# Patient Record
Sex: Female | Born: 1975 | Race: Black or African American | Hispanic: No | Marital: Married | Smoking: Never smoker
Health system: Southern US, Community
[De-identification: ages and names within clinical notes are randomized; demographics above are authoritative.]

## PROBLEM LIST (undated history)

## (undated) DIAGNOSIS — E785 Hyperlipidemia, unspecified: Secondary | ICD-10-CM

## (undated) DIAGNOSIS — K219 Gastro-esophageal reflux disease without esophagitis: Secondary | ICD-10-CM

## (undated) DIAGNOSIS — T7840XA Allergy, unspecified, initial encounter: Secondary | ICD-10-CM

## (undated) HISTORY — PX: OTHER SURGICAL HISTORY: SHX169

## (undated) HISTORY — DX: Allergy, unspecified, initial encounter: T78.40XA

## (undated) HISTORY — DX: Hyperlipidemia, unspecified: E78.5

---

## 2005-07-25 ENCOUNTER — Other Ambulatory Visit: Admission: RE | Admit: 2005-07-25 | Discharge: 2005-07-25 | Payer: Self-pay | Admitting: Obstetrics and Gynecology

## 2005-08-19 ENCOUNTER — Encounter: Admission: RE | Admit: 2005-08-19 | Discharge: 2005-08-19 | Payer: Self-pay | Admitting: Obstetrics and Gynecology

## 2006-12-05 ENCOUNTER — Encounter: Admission: RE | Admit: 2006-12-05 | Discharge: 2006-12-05 | Payer: Self-pay | Admitting: Obstetrics and Gynecology

## 2008-01-31 ENCOUNTER — Inpatient Hospital Stay (HOSPITAL_COMMUNITY): Admission: AD | Admit: 2008-01-31 | Discharge: 2008-02-04 | Payer: Self-pay | Admitting: Obstetrics and Gynecology

## 2008-02-09 ENCOUNTER — Ambulatory Visit: Admission: RE | Admit: 2008-02-09 | Discharge: 2008-02-09 | Payer: Self-pay | Admitting: Obstetrics and Gynecology

## 2008-12-26 ENCOUNTER — Emergency Department (HOSPITAL_COMMUNITY): Admission: EM | Admit: 2008-12-26 | Discharge: 2008-12-26 | Payer: Self-pay | Admitting: Emergency Medicine

## 2009-09-03 ENCOUNTER — Inpatient Hospital Stay (HOSPITAL_COMMUNITY): Admission: AD | Admit: 2009-09-03 | Discharge: 2009-09-03 | Payer: Self-pay | Admitting: Obstetrics and Gynecology

## 2009-09-03 ENCOUNTER — Ambulatory Visit: Payer: Self-pay | Admitting: Obstetrics and Gynecology

## 2009-09-10 ENCOUNTER — Inpatient Hospital Stay (HOSPITAL_COMMUNITY): Admission: AD | Admit: 2009-09-10 | Discharge: 2009-09-15 | Payer: Self-pay | Admitting: Obstetrics and Gynecology

## 2009-09-11 ENCOUNTER — Encounter (INDEPENDENT_AMBULATORY_CARE_PROVIDER_SITE_OTHER): Payer: Self-pay | Admitting: Obstetrics & Gynecology

## 2010-02-14 ENCOUNTER — Encounter: Admission: RE | Admit: 2010-02-14 | Discharge: 2010-02-14 | Payer: Self-pay | Admitting: Obstetrics and Gynecology

## 2010-08-17 ENCOUNTER — Encounter
Admission: RE | Admit: 2010-08-17 | Discharge: 2010-08-17 | Payer: Self-pay | Source: Home / Self Care | Attending: Obstetrics and Gynecology | Admitting: Obstetrics and Gynecology

## 2010-10-28 LAB — CBC
HCT: 38.8 % (ref 36.0–46.0)
RDW: 14.3 % (ref 11.5–15.5)
WBC: 10.6 10*3/uL — ABNORMAL HIGH (ref 4.0–10.5)

## 2010-10-28 LAB — WET PREP, GENITAL
Clue Cells Wet Prep HPF POC: NONE SEEN
Trich, Wet Prep: NONE SEEN

## 2010-10-31 LAB — CBC
HCT: 36.7 % (ref 36.0–46.0)
Hemoglobin: 12 g/dL (ref 12.0–15.0)
MCHC: 32.6 g/dL (ref 30.0–36.0)
MCV: 87.2 fL (ref 78.0–100.0)
Platelets: 165 10*3/uL (ref 150–400)
RBC: 4.21 MIL/uL (ref 3.87–5.11)
WBC: 16.7 10*3/uL — ABNORMAL HIGH (ref 4.0–10.5)

## 2010-12-25 NOTE — Op Note (Signed)
NAME:  Cathy Hamilton, Cathy Hamilton NO.:  [id number]   MEDICAL RECORD NO.:  [id number]          PATIENT TYPE:  INP   LOCATION:  9124                          FACILITY:  [facility]   PHYSICIAN:  Juluis Mire, M.D.   DATE OF BIRTH:  [birth year]   DATE OF PROCEDURE:  02/01/2008  DATE OF DISCHARGE:                               OPERATIVE REPORT   PREOPERATIVE DIAGNOSING:  Pregnancy at 41 weeks with failed vacuum  extractor and subsequent failure to descend.   POSTOPERATIVE DIAGNOSIS:  Pregnancy at 41 weeks with failed vacuum  extractor and subsequent failure to descend.   PROCEDURE:  Low transverse cesarean section.   SURGEON:  Juluis Mire, MD.   ANESTHESIA:  Epidural.   ESTIMATED BLOOD LOSS:  700 mL.   PACKS AND DRAINS:  None.   INTRAOPERATIVE BLOOD PLACED:  None.   COMPLICATIONS:  None.   INDICATIONS:  The patient is a 35 year old gravida 3, para 0 female at  31 weeks present on January 31, 2008, for Cytotec ripening cervix.  Subsequent induction was begun on the morning of February 01, 2008.  She  progressed to complete dilatation after 2-1/2 hours of pushing the fetal  vertex, would crown, but then would retract.  We attempted a vacuum  extractor after the risks were discussed.  This was unsuccessful  bringing the vertex any further down out of the pelvis.  On palpation,  she did have very prominent spines and neural inlet.  We felt that this  was leading to inability the vertex to deliver.  Decision was to proceed  with a primary cesarean section.  The risks were discussed including the  risk of infection.  Risk of hemorrhage that could require transfusion  with the risk of AIDS or hepatitis, risk of injury to adjacent organs  including bladder, bowel, ureters could require further exploratory  surgery, risk of deep venous thrombosis and pulmonary emboli.   PROCEDURE:  The patient was taken to the OR, placed in supine position  with left lateral tilt.  After satisfactory  level of epidural anesthesia  had been attained, the abdomen was prepped out with Betadine and draped  as sterile field.  A low transverse skin incision made with knife,  carried through subcutaneous tissue.  The fascia was entered sharply and  incision the fascia laterally.  Fascia taken off the muscle superiorly  and inferiorly.  Rectus muscles were separated in the midline.  Peritoneum was entered sharply.  Incision of the peritoneum was extended  both superiorly and inferiorly.  A low transverse bladder flap was  developed.  A low transverse uterine incision begun with knife and  extended laterally using manual traction.  She had 1+ meconium stained  fluid.  The fetal vertex was wedged into the pelvis.  The nurse was able  to push it out vaginally and we were able then to do elevation head and  fundal pressure to deliver a viable female. Apgars were 8/9, umbilical  artery pH was 7.27.  Weight is pending.  Placenta was delivered  manually.  Uterus was exteriorized for closure.  Evaluation of the  uterine incision revealed  no extension.  Uterine incision was closed  with locking suture of 0 chromic using 2-layer closure technique.  Urine  output was clear and adequate.  Tubes and ovaries were unremarkable.  No  active bleeding was noted.  Uterus was returned to the abdominal cavity.  We irrigated the pelvis, had excellent hemostasis.  Muscles and  peritoneum closed with a suture of 3-0 Vicryl.  Fascia was closed with  running suture of 0 PDS.  Skin was closed with staples and Steri-Strips.  Sponge, instrument, and needle counts were reported as correct by  circulating nurse x2.  Foley catheter remained clear at the time of  closure.  The patient tolerated the procedure well and was returned to  the recovery room in good condition.      Juluis Mire, M.D.  Electronically Signed     JSM/MEDQ  D:  02/01/2008  T:  02/02/2008  Job:  [id number]

## 2010-12-28 NOTE — Discharge Summary (Signed)
Cathy Hamilton, Cathy Hamilton           ACCOUNT NO.:  [id number]   MEDICAL RECORD NO.:  [id number]         PATIENT TYPE:  WINP   LOCATION:                                [facility]  [facility]   PHYSICIAN:  Michelle L. Grewal, M.D.DATE OF BIRTH:  Jul 23, 1976   DATE OF ADMISSION:  01/31/2008  DATE OF DISCHARGE:  02/04/2008                               DISCHARGE SUMMARY   ADMITTING DIAGNOSES:  1. Intrauterine pregnancy at 70 weeks estimated gestational age.  2. Two-stage induction of labor secondary to post date.   DISCHARGE DIAGNOSIS:  1. Status post low-transverse cesarean section.  2. Viable female infant.   PROCEDURE:  Primary low-transverse cesarean section.   REASON FOR ADMISSION:  Please see written H&P.   HOSPITAL COURSE:  The patient 35 year old gravida 3, para 0 that was  admitted to [facility] at 41 weeks estimated gestational  age for a two-stage induction of labor.  On admission, the patient was  taken to labor and delivery area where Cytotec was administered for  ripening of the cervix.  On the following morning, the patient was  started on Pitocin and she did achieve full dilatation.  After pushing  for approximately 2-1/2 hours the fetal vertex would crown, but then  would retract.  Decision was made to attempt a vacuum extraction  delivery.  This was not successful and the patient was noted to have  very prominent response and a narrow inlet and this was thought to be  the difficulty with inability for the vertex to deliver.  Decision was  made to transfer the patient to the operating room where epidural was  dosed to an adequate surgical level.  A low-transverse incision was  made, delivery of a viable female infant, weighing 8 pounds 5 ounces  with Apgars of 9 at and 9 at 5 minute.  Arterial cord pH was  7.27.  The patient tolerated procedure well and was taken to recovery  room in stable condition.  On postoperative day #1, the patient was  without  complaint.  Vital signs were stable.  She was afebrile.  Abdomen  soft with good return of bowel function.  The abdominal dressing had to  be clean, dry, and intact.  Laboratory findings revealed hemoglobin of  11.6, platelet count 129,000, and WBC count of 15.1.  On postoperative  day #2, the patient was without complaint.  Vital signs were stable.  She is afebrile.  Fundus firm and nontender.  Incision was clean, dry,  and intact.  She was ambulating well.  Laboratory values revealed  hemoglobin of 11.9, platelet count was up to 168,000, and WBC count of  15.0.  On postoperative day #3, the patient was without complaint.  Vital signs were stable.  She was afebrile.  Fundus firm and nontender.  Incision was clean, dry, and intact.  Staples removed and the patient  was later discharged home.   CONDITION ON DISCHARGE:  Stable.   DIET:  Regular as tolerated.   ACTIVITY:  No heavy lifting, no driving x2 weeks, and no vaginal entry.   FOLLOWUP:  The patient to follow  up in the office in 1-2 weeks for an  incision check.  She is to call for temperature greater than 100  degrees, persistent nausea, vomiting, heavy vaginal bleeding, and/or  redness or drainage from incisional site.   DISCHARGE MEDICATIONS:  1. Tylox #31 p.o. q.4-6 h. p.r.n.  2. Motrin 600 mg every 6 hours.  3. Prenatal vitamins 1 p.o. daily.  4. Colace 1 p.o. daily p.r.n.      Julio Sicks, N.P.      Stann Mainland. Vincente Poli, M.D.  Electronically Signed    CC/MEDQ  D:  03/09/2008  T:  03/09/2008  Job:  [id number]

## 2011-02-01 ENCOUNTER — Inpatient Hospital Stay (INDEPENDENT_AMBULATORY_CARE_PROVIDER_SITE_OTHER)
Admission: RE | Admit: 2011-02-01 | Discharge: 2011-02-01 | Disposition: A | Discharge status: Home / Self Care | Payer: BC Managed Care – PPO | Source: Ambulatory Visit | Attending: Family Medicine | Admitting: Family Medicine

## 2011-02-01 DIAGNOSIS — T2610XA Burn of cornea and conjunctival sac, unspecified eye, initial encounter: Secondary | ICD-10-CM

## 2011-05-09 LAB — CBC
HCT: 35.8 — ABNORMAL LOW
Hemoglobin: 11.6 — ABNORMAL LOW
Hemoglobin: 11.9 — ABNORMAL LOW
MCHC: 33.2
Platelets: 166
RBC: 4.07
RDW: 15.6 — ABNORMAL HIGH
RDW: 16.3 — ABNORMAL HIGH
WBC: 15.1 — ABNORMAL HIGH

## 2011-05-09 LAB — RPR: RPR Ser Ql: NONREACTIVE

## 2011-08-13 HISTORY — DX: Maternal care for unspecified type scar from previous cesarean delivery: O34.219

## 2011-08-16 LAB — OB RESULTS CONSOLE ABO/RH

## 2011-08-21 ENCOUNTER — Ambulatory Visit (INDEPENDENT_AMBULATORY_CARE_PROVIDER_SITE_OTHER): Payer: 59

## 2011-08-21 DIAGNOSIS — R059 Cough, unspecified: Secondary | ICD-10-CM

## 2011-08-21 DIAGNOSIS — R05 Cough: Secondary | ICD-10-CM

## 2011-09-16 ENCOUNTER — Other Ambulatory Visit: Payer: Self-pay | Admitting: Obstetrics and Gynecology

## 2011-09-16 DIAGNOSIS — D249 Benign neoplasm of unspecified breast: Secondary | ICD-10-CM

## 2011-09-20 ENCOUNTER — Ambulatory Visit
Admission: RE | Admit: 2011-09-20 | Discharge: 2011-09-20 | Disposition: A | Discharge status: Home / Self Care | Payer: 59 | Source: Ambulatory Visit | Attending: Obstetrics and Gynecology | Admitting: Obstetrics and Gynecology

## 2011-09-20 DIAGNOSIS — D249 Benign neoplasm of unspecified breast: Secondary | ICD-10-CM

## 2012-03-10 ENCOUNTER — Encounter (HOSPITAL_COMMUNITY): Payer: Self-pay

## 2012-03-11 ENCOUNTER — Encounter (HOSPITAL_COMMUNITY): Payer: Self-pay

## 2012-03-11 ENCOUNTER — Encounter (HOSPITAL_COMMUNITY)
Admission: RE | Admit: 2012-03-11 | Discharge: 2012-03-11 | Disposition: A | Discharge status: Home / Self Care | Payer: 59 | Source: Ambulatory Visit | Attending: Obstetrics and Gynecology | Admitting: Obstetrics and Gynecology

## 2012-03-11 ENCOUNTER — Encounter (HOSPITAL_COMMUNITY): Payer: Self-pay | Admitting: Pharmacist

## 2012-03-11 HISTORY — DX: Gastro-esophageal reflux disease without esophagitis: K21.9

## 2012-03-11 LAB — CBC
Hemoglobin: 11.7 g/dL — ABNORMAL LOW (ref 12.0–15.0)
MCH: 26.3 pg (ref 26.0–34.0)
MCV: 82.2 fL (ref 78.0–100.0)
Platelets: 145 10*3/uL — ABNORMAL LOW (ref 150–400)
RBC: 4.45 MIL/uL (ref 3.87–5.11)
RDW: 14.4 % (ref 11.5–15.5)
WBC: 7.7 10*3/uL (ref 4.0–10.5)

## 2012-03-11 LAB — ABO/RH: ABO/RH(D): B POS

## 2012-03-11 LAB — TYPE AND SCREEN: Antibody Screen: NEGATIVE

## 2012-03-11 NOTE — Patient Instructions (Addendum)
   Your procedure is scheduled on: Friday August 9th  Enter through the Main Entrance of [facility] at: [employer] up the phone at the desk and dial [phone] and inform us of your arrival.  Please call this number if you have any problems the morning of surgery: [phone]  Remember: Do not eat food after midnight: Thursday Do not drink clear liquids after: midnight Thursday Take these medicines the morning of surgery with a SIP OF WATER: none  Do not wear jewelry, make-up, or FINGER nail polish No metal in your hair or on your body. Do not wear lotions, powders, perfumes or deodorant. Do not shave 48 hours prior to surgery. Do not bring valuables to the hospital. Contacts, dentures or bridgework may not be worn into surgery.  Leave suitcase in the car. After Surgery it may be brought to your room. For patients being admitted to the hospital, checkout time is 11:00am the day of discharge.  Patients discharged on the day of surgery will not be allowed to drive home.     Remember to use your hibiclens as instructed.Please shower with 1/2 bottle the evening before your surgery and the other 1/2 bottle the morning of surgery. Neck down avoiding private area.

## 2012-03-13 ENCOUNTER — Other Ambulatory Visit (HOSPITAL_COMMUNITY): Payer: 59

## 2012-03-13 NOTE — H&P (Signed)
  Patient name  Eliska, Hamil DICTATION#  [id number] CSN# [id number]  Juluis Mire, MD 03/13/2012 8:58 AM

## 2012-03-16 NOTE — H&P (Signed)
NAME:  Cathy Hamilton, Cathy Hamilton NO.:  [id number]  MEDICAL RECORD NO.:  [id number]  LOCATION:                                 FACILITY:  PHYSICIAN:  Juluis Mire, M.D.   DATE OF BIRTH:  [birth year]  DATE OF ADMISSION: DATE OF DISCHARGE:                             HISTORY & PHYSICAL   Date of planned surgery is March 20, 2012, at [facility].  The patient is a 36 year old, gravida 5, para 2, married female, estimated date confinement March 23, 2012, given her estimated gestational age of 39+ weeks.  She presents for repeat cesarean section.  Her prenatal course has been uncomplicated.  She had no evidence of gestational diabetes.  Group B strep was negative.  She had a prior cesarean section done in 2009 for failure to descent.  Subsequently had a trial of labor.  In 2011, had uterine rupture requiring emergent cesarean section.  She now presents for repeat cesarean section.  In terms of allergies, she is allergic to penicillin that causes a rash.  MEDICATIONS:  Include prenatal vitamins.  PAST MEDICAL HISTORY:  Please see prenatal records.  FAMILY HISTORY:  Please see prenatal records.  SOCIAL HISTORY:  Please see prenatal records.  REVIEW OF SYSTEMS:  Noncontributory.  PHYSICAL EXAMINATION:  VITAL SIGNS:  The patient is afebrile.  Stable vital signs. HEENT:  The patient is normocephalic.  Pupils are equal, round, reactive to light and accommodation.  Extraocular movements are intact.  Sclerae and conjunctivae are clear.  Oropharynx was clear. NECK:  Without thyromegaly. BREASTS:  Not examined. LUNGS:  Clear. CARDIOVASCULAR:  Regular rhythm and rate without murmurs or gallops. ABDOMEN:  Gravid uterus consistent with dates.  Well-healed low- transverse incision.  Cervix not examined. EXTREMITIES:  Trace edema. NEUROLOGIC:  Grossly within normal limits.  IMPRESSION: 1. Intrauterine pregnancy at term with 2 prior cesarean sections. 2. Repeat cesarean  section.  PLAN:  The patient will undergo repeat cesarean section.  Risks of surgery have been discussed including the risk of infection.  The risk of hemorrhage could require transfusion with the risk of AIDS or hepatitis.  Excessive bleeding could require hysterectomy.  There is a risk of injury to adjacent organs including bladder, bowel, ureters that could require further exploratory surgery.  Risk of deep venous thrombosis and pulmonary embolus.  The patient appears to understand potential risks and complications.     Juluis Mire, M.D.     JSM/MEDQ  D:  03/13/2012  T:  03/14/2012  Job:  [id number]

## 2012-03-19 MED ORDER — GENTAMICIN SULFATE 40 MG/ML IJ SOLN
INTRAVENOUS | Status: AC
  Administered 2012-03-20: 100 mL via INTRAVENOUS
  Filled 2012-03-19: qty 10.15

## 2012-03-20 ENCOUNTER — Encounter (HOSPITAL_COMMUNITY)
Admission: RE | Disposition: A | Discharge status: Home / Self Care | Payer: Self-pay | Source: Ambulatory Visit | Attending: Obstetrics and Gynecology

## 2012-03-20 ENCOUNTER — Encounter (HOSPITAL_COMMUNITY): Payer: Self-pay | Admitting: *Deleted

## 2012-03-20 ENCOUNTER — Encounter (HOSPITAL_COMMUNITY): Payer: Self-pay | Admitting: Anesthesiology

## 2012-03-20 ENCOUNTER — Inpatient Hospital Stay (HOSPITAL_COMMUNITY)
Admission: RE | Admit: 2012-03-20 | Discharge: 2012-03-22 | DRG: 766 | Disposition: A | Discharge status: Home / Self Care | Payer: 59 | Source: Ambulatory Visit | Attending: Obstetrics and Gynecology | Admitting: Obstetrics and Gynecology

## 2012-03-20 ENCOUNTER — Inpatient Hospital Stay (HOSPITAL_COMMUNITY): Payer: 59 | Admitting: Anesthesiology

## 2012-03-20 DIAGNOSIS — O34219 Maternal care for unspecified type scar from previous cesarean delivery: Principal | ICD-10-CM

## 2012-03-20 DIAGNOSIS — Z98891 History of uterine scar from previous surgery: Secondary | ICD-10-CM

## 2012-03-20 LAB — TYPE AND SCREEN
ABO/RH(D): B POS
Antibody Screen: NEGATIVE

## 2012-03-20 LAB — CBC
MCH: 26.2 pg (ref 26.0–34.0)
MCHC: 32.3 g/dL (ref 30.0–36.0)
Platelets: 144 10*3/uL — ABNORMAL LOW (ref 150–400)

## 2012-03-20 SURGERY — Surgical Case
Anesthesia: Spinal | Site: Abdomen | Wound class: Clean Contaminated

## 2012-03-20 MED ORDER — KETOROLAC TROMETHAMINE 30 MG/ML IJ SOLN: 30.0000 mg | Freq: Four times a day (QID) | INTRAMUSCULAR | Status: AC | PRN

## 2012-03-20 MED ORDER — SODIUM CHLORIDE 0.9 % IJ SOLN: 3.0000 mL | INTRAMUSCULAR | Status: DC | PRN

## 2012-03-20 MED ORDER — WITCH HAZEL-GLYCERIN EX PADS: 1.0000 "application " | MEDICATED_PAD | CUTANEOUS | Status: DC | PRN

## 2012-03-20 MED ORDER — NALBUPHINE HCL 10 MG/ML IJ SOLN
5.0000 mg | INTRAMUSCULAR | Status: DC | PRN
  Filled 2012-03-20: qty 1

## 2012-03-20 MED ORDER — SIMETHICONE 80 MG PO CHEW
80.0000 mg | CHEWABLE_TABLET | Freq: Three times a day (TID) | ORAL | Status: DC
  Administered 2012-03-20 – 2012-03-22 (×8): 80 mg via ORAL

## 2012-03-20 MED ORDER — OXYTOCIN 40 UNITS IN LACTATED RINGERS INFUSION - SIMPLE MED: 62.5000 mL/h | INTRAVENOUS | Status: AC

## 2012-03-20 MED ORDER — SCOPOLAMINE 1 MG/3DAYS TD PT72: 1.0000 | MEDICATED_PATCH | Freq: Once | TRANSDERMAL | Status: DC

## 2012-03-20 MED ORDER — SIMETHICONE 80 MG PO CHEW: 80.0000 mg | CHEWABLE_TABLET | ORAL | Status: DC | PRN

## 2012-03-20 MED ORDER — ONDANSETRON HCL 4 MG/2ML IJ SOLN: 4.0000 mg | INTRAMUSCULAR | Status: DC | PRN

## 2012-03-20 MED ORDER — ZOLPIDEM TARTRATE 5 MG PO TABS: 5.0000 mg | ORAL_TABLET | Freq: Every evening | ORAL | Status: DC | PRN

## 2012-03-20 MED ORDER — NALOXONE HCL 0.4 MG/ML IJ SOLN: 0.4000 mg | INTRAMUSCULAR | Status: DC | PRN

## 2012-03-20 MED ORDER — SODIUM CHLORIDE 0.9 % IJ SOLN: 3.0000 mL | Freq: Two times a day (BID) | INTRAMUSCULAR | Status: DC

## 2012-03-20 MED ORDER — LANOLIN HYDROUS EX OINT: 1.0000 "application " | TOPICAL_OINTMENT | CUTANEOUS | Status: DC | PRN

## 2012-03-20 MED ORDER — ONDANSETRON HCL 4 MG/2ML IJ SOLN
INTRAMUSCULAR | Status: DC | PRN
  Administered 2012-03-20: 4 mg via INTRAVENOUS

## 2012-03-20 MED ORDER — PRENATAL MULTIVITAMIN CH
1.0000 | ORAL_TABLET | Freq: Every day | ORAL | Status: DC
  Administered 2012-03-21 – 2012-03-22 (×2): 1 via ORAL
  Filled 2012-03-20 (×2): qty 1

## 2012-03-20 MED ORDER — FLEET ENEMA 7-19 GM/118ML RE ENEM: 1.0000 | ENEMA | Freq: Every day | RECTAL | Status: DC | PRN

## 2012-03-20 MED ORDER — IBUPROFEN 600 MG PO TABS
600.0000 mg | ORAL_TABLET | Freq: Four times a day (QID) | ORAL | Status: DC
  Administered 2012-03-20 – 2012-03-22 (×8): 600 mg via ORAL
  Filled 2012-03-20 (×2): qty 1

## 2012-03-20 MED ORDER — KETOROLAC TROMETHAMINE 60 MG/2ML IM SOLN
60.0000 mg | Freq: Once | INTRAMUSCULAR | Status: AC | PRN
  Administered 2012-03-20: 60 mg via INTRAMUSCULAR

## 2012-03-20 MED ORDER — OXYCODONE-ACETAMINOPHEN 5-325 MG PO TABS: 1.0000 | ORAL_TABLET | ORAL | Status: DC | PRN

## 2012-03-20 MED ORDER — BISACODYL 10 MG RE SUPP: 10.0000 mg | Freq: Every day | RECTAL | Status: DC | PRN

## 2012-03-20 MED ORDER — LACTATED RINGERS IV SOLN
INTRAVENOUS | Status: DC
  Administered 2012-03-20: 14:00:00 via INTRAVENOUS

## 2012-03-20 MED ORDER — ONDANSETRON HCL 4 MG PO TABS: 4.0000 mg | ORAL_TABLET | ORAL | Status: DC | PRN

## 2012-03-20 MED ORDER — BUPIVACAINE IN DEXTROSE 0.75-8.25 % IT SOLN
INTRATHECAL | Status: DC | PRN
  Administered 2012-03-20: 1.4 mL via INTRATHECAL

## 2012-03-20 MED ORDER — SCOPOLAMINE 1 MG/3DAYS TD PT72
1.0000 | MEDICATED_PATCH | Freq: Once | TRANSDERMAL | Status: DC
  Filled 2012-03-20: qty 1

## 2012-03-20 MED ORDER — MORPHINE SULFATE (PF) 0.5 MG/ML IJ SOLN
INTRAMUSCULAR | Status: DC | PRN
  Administered 2012-03-20: .1 mg via INTRATHECAL

## 2012-03-20 MED ORDER — MENTHOL 3 MG MT LOZG: 1.0000 | LOZENGE | OROMUCOSAL | Status: DC | PRN

## 2012-03-20 MED ORDER — DIPHENHYDRAMINE HCL 25 MG PO CAPS: 25.0000 mg | ORAL_CAPSULE | Freq: Four times a day (QID) | ORAL | Status: DC | PRN

## 2012-03-20 MED ORDER — DIPHENHYDRAMINE HCL 25 MG PO CAPS: 25.0000 mg | ORAL_CAPSULE | ORAL | Status: DC | PRN

## 2012-03-20 MED ORDER — SENNOSIDES-DOCUSATE SODIUM 8.6-50 MG PO TABS
2.0000 | ORAL_TABLET | Freq: Every day | ORAL | Status: DC
  Administered 2012-03-20 – 2012-03-21 (×2): 2 via ORAL

## 2012-03-20 MED ORDER — DIPHENHYDRAMINE HCL 50 MG/ML IJ SOLN: 12.5000 mg | INTRAMUSCULAR | Status: DC | PRN

## 2012-03-20 MED ORDER — METOCLOPRAMIDE HCL 5 MG/ML IJ SOLN: 10.0000 mg | Freq: Three times a day (TID) | INTRAMUSCULAR | Status: DC | PRN

## 2012-03-20 MED ORDER — LACTATED RINGERS IV SOLN
INTRAVENOUS | Status: DC
  Administered 2012-03-20: 125 mL/h via INTRAVENOUS
  Administered 2012-03-20 (×2): via INTRAVENOUS

## 2012-03-20 MED ORDER — TETANUS-DIPHTH-ACELL PERTUSSIS 5-2.5-18.5 LF-MCG/0.5 IM SUSP: 0.5000 mL | Freq: Once | INTRAMUSCULAR | Status: DC

## 2012-03-20 MED ORDER — PHENYLEPHRINE HCL 10 MG/ML IJ SOLN
INTRAMUSCULAR | Status: DC | PRN
  Administered 2012-03-20: 80 ug via INTRAVENOUS
  Administered 2012-03-20 (×3): 40 ug via INTRAVENOUS
  Administered 2012-03-20: 80 ug via INTRAVENOUS
  Administered 2012-03-20 (×2): 40 ug via INTRAVENOUS

## 2012-03-20 MED ORDER — IBUPROFEN 600 MG PO TABS
600.0000 mg | ORAL_TABLET | Freq: Four times a day (QID) | ORAL | Status: DC | PRN
  Filled 2012-03-20 (×6): qty 1

## 2012-03-20 MED ORDER — ONDANSETRON HCL 4 MG/2ML IJ SOLN: 4.0000 mg | Freq: Three times a day (TID) | INTRAMUSCULAR | Status: DC | PRN

## 2012-03-20 MED ORDER — FENTANYL CITRATE 0.05 MG/ML IJ SOLN: 25.0000 ug | INTRAMUSCULAR | Status: DC | PRN

## 2012-03-20 MED ORDER — SODIUM CHLORIDE 0.9 % IV SOLN: 250.0000 mL | INTRAVENOUS | Status: DC

## 2012-03-20 MED ORDER — FENTANYL CITRATE 0.05 MG/ML IJ SOLN
INTRAMUSCULAR | Status: DC | PRN
  Administered 2012-03-20: 12.5 ug via INTRATHECAL

## 2012-03-20 MED ORDER — MEPERIDINE HCL 25 MG/ML IJ SOLN: 6.2500 mg | INTRAMUSCULAR | Status: DC | PRN

## 2012-03-20 MED ORDER — DIBUCAINE 1 % RE OINT: 1.0000 "application " | TOPICAL_OINTMENT | RECTAL | Status: DC | PRN

## 2012-03-20 MED ORDER — DIPHENHYDRAMINE HCL 50 MG/ML IJ SOLN: 25.0000 mg | INTRAMUSCULAR | Status: DC | PRN

## 2012-03-20 MED ORDER — OXYTOCIN 40 UNITS IN LACTATED RINGERS INFUSION - SIMPLE MED
INTRAVENOUS | Status: DC | PRN
  Administered 2012-03-20: 40 [IU] via INTRAVENOUS

## 2012-03-20 MED ORDER — SODIUM CHLORIDE 0.9 % IV SOLN
1.0000 ug/kg/h | INTRAVENOUS | Status: DC | PRN
  Filled 2012-03-20: qty 2.5

## 2012-03-20 SURGICAL SUPPLY — 30 items
CLOTH BEACON ORANGE TIMEOUT ST (SAFETY) ×2 IMPLANT
DRESSING TELFA 8X3 (GAUZE/BANDAGES/DRESSINGS) ×1 IMPLANT
DRSG COVADERM 4X10 (GAUZE/BANDAGES/DRESSINGS) ×1 IMPLANT
ELECT REM PT RETURN 9FT ADLT (ELECTROSURGICAL) ×2
ELECTRODE REM PT RTRN 9FT ADLT (ELECTROSURGICAL) ×1 IMPLANT
EXTRACTOR VACUUM M CUP 4 TUBE (SUCTIONS) IMPLANT
GAUZE SPONGE 4X4 12PLY STRL LF (GAUZE/BANDAGES/DRESSINGS) ×3 IMPLANT
GLOVE BIO SURGEON STRL SZ7 (GLOVE) ×4 IMPLANT
GOWN PREVENTION PLUS LG XLONG (DISPOSABLE) ×4 IMPLANT
KIT ABG SYR 3ML LUER SLIP (SYRINGE) ×2 IMPLANT
NDL HYPO 25X5/8 SAFETYGLIDE (NEEDLE) ×1 IMPLANT
NEEDLE HYPO 25X5/8 SAFETYGLIDE (NEEDLE) ×2 IMPLANT
NS IRRIG 1000ML POUR BTL (IV SOLUTION) ×2 IMPLANT
PACK C SECTION WH (CUSTOM PROCEDURE TRAY) ×2 IMPLANT
PAD ABD 7.5X8 STRL (GAUZE/BANDAGES/DRESSINGS) ×1 IMPLANT
SLEEVE SCD COMPRESS KNEE MED (MISCELLANEOUS) IMPLANT
STAPLER VISISTAT 35W (STAPLE) IMPLANT
STRIP CLOSURE SKIN 1/4X4 (GAUZE/BANDAGES/DRESSINGS) ×2 IMPLANT
SUT CHROMIC 0 CT 1 (SUTURE) IMPLANT
SUT CHROMIC 0 CTX 36 (SUTURE) ×4 IMPLANT
SUT CHROMIC 2 0 SH (SUTURE) IMPLANT
SUT PDS AB 0 CT 36 (SUTURE) ×2 IMPLANT
SUT PLAIN 0 NONE (SUTURE) IMPLANT
SUT PLAIN 2 0 XLH (SUTURE) IMPLANT
SUT VIC AB 3-0 CT1 27 (SUTURE) ×2
SUT VIC AB 3-0 CT1 TAPERPNT 27 (SUTURE) ×1 IMPLANT
TAPE CLOTH SURG 4X10 WHT LF (GAUZE/BANDAGES/DRESSINGS) ×1 IMPLANT
TOWEL OR 17X24 6PK STRL BLUE (TOWEL DISPOSABLE) ×4 IMPLANT
TRAY FOLEY CATH 14FR (SET/KITS/TRAYS/PACK) ×2 IMPLANT
WATER STERILE IRR 1000ML POUR (IV SOLUTION) ×2 IMPLANT

## 2012-03-20 NOTE — Anesthesia Procedure Notes (Signed)
Spinal  Patient location during procedure: OR Start time: 03/20/2012 7:24 AM End time: 03/20/2012 7:27 AM Staffing Anesthesiologist: Sandrea Hughs Performed by: anesthesiologist  Preanesthetic Checklist Completed: patient identified, site marked, surgical consent, pre-op evaluation, timeout performed, IV checked, risks and benefits discussed and monitors and equipment checked Spinal Block Patient position: sitting Prep: DuraPrep Patient monitoring: heart rate, cardiac monitor, continuous pulse ox and blood pressure Approach: midline Location: L3-4 Injection technique: single-shot Needle Needle type: Sprotte  Needle gauge: 24 G Needle length: 9 cm Needle insertion depth: 7 cm Assessment Sensory level: T4

## 2012-03-20 NOTE — Addendum Note (Signed)
Addendum  created 03/20/12 1314 by Algis Greenhouse, CRNA   Modules edited:Notes Section

## 2012-03-20 NOTE — H&P (Signed)
  History and physical exam unchanged 

## 2012-03-20 NOTE — Anesthesia Postprocedure Evaluation (Signed)
  Anesthesia Post Note  Patient: Cathy Hamilton  Procedure(s) Performed: Procedure(s) (LRB): CESAREAN SECTION (N/A)  Anesthesia type: Spinal  Patient location: Mother/Baby  Post pain: Pain level controlled  Post assessment: Post-op Vital signs reviewed  Last Vitals:  Filed Vitals:   03/20/12 0930  BP: 100/61  Pulse: 63  Temp: 36.2 C  Resp: 16    Post vital signs: Reviewed  Level of consciousness: awake  Complications: No apparent anesthesia complications

## 2012-03-20 NOTE — Op Note (Signed)
Patient name  Cathy Hamilton, Cathy Hamilton DICTATION#[id number] CSN# [id number]  Juluis Mire, MD 03/20/2012 8:10 AM

## 2012-03-20 NOTE — Transfer of Care (Signed)
Immediate Anesthesia Transfer of Care Note  Patient: Cathy Hamilton  Procedure(s) Performed: Procedure(s) (LRB): CESAREAN SECTION (N/A)  Patient Location: PACU  Anesthesia Type: Spinal  Level of Consciousness: awake, alert  and oriented  Airway & Oxygen Therapy: Patient Spontanous Breathing  Post-op Assessment: Report given to PACU RN and Post -op Vital signs reviewed and stable  Post vital signs: Reviewed and stable  Complications: No apparent anesthesia complications

## 2012-03-20 NOTE — Anesthesia Postprocedure Evaluation (Signed)
Anesthesia Post Note  Patient: Cathy Hamilton  Procedure(s) Performed: Procedure(s) (LRB): CESAREAN SECTION (N/A)  Anesthesia type: Spinal  Patient location: PACU  Post pain: Pain level controlled  Post assessment: Post-op Vital signs reviewed  Last Vitals:  Filed Vitals:   03/20/12 0815  BP:   Pulse:   Temp: 36.6 C  Resp:     Post vital signs: Reviewed  Level of consciousness: awake  Complications: No apparent anesthesia complications

## 2012-03-20 NOTE — Op Note (Signed)
NAME:  Cathy Hamilton, Cathy Hamilton NO.:  [id number]  MEDICAL RECORD NO.:  [id number]  LOCATION:  [facility]                          [facility]ILITY:  [facility]  PHYSICIAN:  Juluis Mire, M.D.   DATE OF BIRTH:  08-20-1975  DATE OF PROCEDURE:  03/20/2012 DATE OF DISCHARGE:                              OPERATIVE REPORT   PREOPERATIVE DIAGNOSIS:  Intrauterine pregnancy at 39 weeks with prior cesarean section for repeat.  POSTOPERATIVE DIAGNOSIS:  Intrauterine pregnancy at 39 weeks with prior cesarean section for repeat.  OPERATIVE PROCEDURE:  Low transverse cesarean section.  SURGEON:  Juluis Mire, MD  ASSISTANT:  Mitchel Honour.  ANESTHESIA:  Spinal.  ESTIMATED BLOOD LOSS:  600 mL.  PACKS:  None.  DRAINS:  Urethral Foley.  INTRAOPERATIVE BLOOD PLACED:  None.  COMPLICATIONS:  None.  INDICATION:  Dictated in history and physical.  PROCEDURE:  The patient was taken to the OR and placed in supine position with left lateral tilt.  After satisfactory level of spinal anesthesia was obtained, the abdomen was prepped out with Betadine and draped in sterile field.  A prior low transverse skin incision was excised.  The incision was then extended through subcutaneous tissue. Fascia was identified, entered sharply, incision fashioned laterally. Fascia taken off the muscle superiorly and inferiorly.  Rectus muscles were separated in the midline.  Anterior peritoneum was entered sharply. Incision of perineum extended both superiorly and inferiorly.  Low transverse bladder flap was developed.  Low transverse uterine incision was begun with knife extended laterally using manual traction.  Amniotic fluid was clear.  The infant presented in the vertex presentation, was delivered with elevation of head and fundal pressure.  There was a nuchal cord x1.  The infant was a viable female, Apgars were 9/9, weight is pending.  Placenta was delivered manually.  Uterus was exteriorized for closure.   Uterus was closed with running locking suture of 0 chromic using a two-layer closure technique.  We had good hemostasis and clear urine output.  Tubes and ovaries were unremarkable.  Uterus returned to the abdominal cavity.  We irrigated the pelvis, had good hemostasis, and again clear urine output.  Muscles and peritoneum were closed with interrupted suture of 3-0 Vicryl.  Fascia closed with a running suture of 0 PDS.  Skin was closed with running subcuticular 3-0 Monocryl.  Steri-Strips were also applied.  Sponge, instrument, and needle count was reported as correct by circulating nurse x2.  Foley catheter remained clear at the time of closure.  The patient tolerated the procedure well, was returned to recovery room in good condition.     Juluis Mire, M.D.     JSM/MEDQ  D:  03/20/2012  T:  03/20/2012  Job:  [id number]

## 2012-03-20 NOTE — Anesthesia Preprocedure Evaluation (Signed)
Anesthesia Evaluation  Patient identified by MRN, date of birth, ID band Patient awake    Reviewed: Allergy & Precautions, H&P , NPO status , Patient's Chart, lab work & pertinent test results, reviewed documented beta blocker date and time   History of Anesthesia Complications Negative for: history of anesthetic complications  Airway Mallampati: II TM Distance: >3 FB Neck ROM: full    Dental  (+) Teeth Intact   Pulmonary neg pulmonary ROS,  breath sounds clear to auscultation        Cardiovascular negative cardio ROS  Rhythm:regular Rate:Normal     Neuro/Psych negative neurological ROS  negative psych ROS   GI/Hepatic Neg liver ROS, GERD- (with pregnancy, no meds)  ,  Endo/Other  negative endocrine ROS  Renal/GU negative Renal ROS  negative genitourinary   Musculoskeletal   Abdominal   Peds  Hematology negative hematology ROS (+)   Anesthesia Other Findings   Reproductive/Obstetrics (+) Pregnancy (h/o c/s x2, for repeat c/s)                           Anesthesia Physical Anesthesia Plan  ASA: II  Anesthesia Plan: Spinal   Post-op Pain Management:    Induction:   Airway Management Planned:   Additional Equipment:   Intra-op Plan:   Post-operative Plan:   Informed Consent: I have reviewed the patients History and Physical, chart, labs and discussed the procedure including the risks, benefits and alternatives for the proposed anesthesia with the patient or authorized representative who has indicated his/her understanding and acceptance.     Plan Discussed with: CRNA and Surgeon  Anesthesia Plan Comments:         Anesthesia Quick Evaluation

## 2012-03-20 NOTE — Consult Note (Signed)
Neonatology Note:   Attendance at C-section:    I was asked to attend this repeat C/S at term due to previous uterine rupture. The mother is a G5P2A2 B pos, GBS neg with an uncomplicated pregnancy. ROM at delivery, fluid clear. Infant vigorous with good spontaneous cry and tone. Needed no bulb suctioning. Ap 9/9. Lungs clear to ausc in DR. To CN to care of Pediatrician.   Deatra James, MD

## 2012-03-20 NOTE — Brief Op Note (Signed)
03/20/2012  8:11 AM  PATIENT:  Cathy Hamilton  36 y.o. female  PRE-OPERATIVE DIAGNOSIS:  Previous Cesarean Section x2  POST-OPERATIVE DIAGNOSIS:  Previous Cesarean Section x2  PROCEDURE:  Procedure(s) (LRB): CESAREAN SECTION (N/A)  SURGEON:  Surgeon(s) and Role:    * Juluis Mire, MD - Primary  PHYSICIAN ASSISTANT:   ASSISTANTS: Morris    ANESTHESIA:   spinal  EBL:  Total I/O In: 2300 [I.V.:2300] Out: 700 [Urine:200; Blood:500]  BLOOD ADMINISTERED:none  DRAINS: Urinary Catheter (Foley)   LOCAL MEDICATIONS USED:  NONE  SPECIMEN:  No Specimen  DISPOSITION OF SPECIMEN:  N/A  COUNTS:  YES  TOURNIQUET:  * No tourniquets in log *  DICTATION: .Other Dictation: Dictation Number [id number]  PLAN OF CARE: Admit to inpatient   PATIENT DISPOSITION:  PACU - hemodynamically stable.   Delay start of Pharmacological VTE agent (>24hrs) due to surgical blood loss or risk of bleeding: no

## 2012-03-21 DIAGNOSIS — O34219 Maternal care for unspecified type scar from previous cesarean delivery: Secondary | ICD-10-CM

## 2012-03-21 DIAGNOSIS — Z98891 History of uterine scar from previous surgery: Secondary | ICD-10-CM

## 2012-03-21 HISTORY — DX: History of uterine scar from previous surgery: Z98.891

## 2012-03-21 LAB — CBC
Hemoglobin: 11.4 g/dL — ABNORMAL LOW (ref 12.0–15.0)
RBC: 4.3 MIL/uL (ref 3.87–5.11)

## 2012-03-21 NOTE — Progress Notes (Signed)
Subjective: Postpartum Day one: Cesarean Delivery Patient reports tolerating PO and no problems voiding.    Objective: Vital signs in last 24 hours: Temp:  [97.8 F (36.6 C)-98.6 F (37 C)] 98.6 F (37 C) (08/10 0729) Pulse Rate:  [58-111] 58  (08/10 0729) Resp:  [16-20] 18  (08/10 0729) BP: (93-110)/(54-64) 103/63 mmHg (08/10 0729) SpO2:  [95 %-99 %] 96 % (08/10 0729)  Physical Exam:  General: alert Lochia: appropriate Uterine Fundus: firm Incision: healing well DVT Evaluation: No evidence of DVT seen on physical exam.   Basename 03/21/12 0555 03/20/12 0700  HGB 11.4* 12.0  HCT 35.4* 37.2    Assessment/Plan: Status post Cesarean section. Doing well postoperatively.  Continue current care.  Lamont Glasscock S 03/21/2012, 9:44 AM

## 2012-03-22 NOTE — Discharge Summary (Signed)
Obstetric Discharge Summary Reason for Admission: cesarean section Prenatal Procedures: none Intrapartum Procedures: cesarean: low cervical, transverse Postpartum Procedures: none Complications-Operative and Postpartum: none Hemoglobin  Date Value Range Status  03/21/2012 11.4* 12.0 - 15.0 g/dL Final     HCT  Date Value Range Status  03/21/2012 35.4* 36.0 - 46.0 % Final    Physical Exam:  General: alert Lochia: appropriate Uterine Fundus: firm Incision: healing well DVT Evaluation: No evidence of DVT seen on physical exam.  Discharge Diagnoses: Term Pregnancy-delivered  Discharge Information: Date: 03/22/2012 Activity: pelvic rest Diet: routine Medications: Ibuprofen and Percocet Condition: stable Instructions: refer to practice specific booklet Discharge to: home   Newborn Data: Live born female  Birth Weight: 8 lb 14.3 oz (4035 g) APGAR: 9, 9  Home with mother.  Tanis Burnley S 03/22/2012, 10:36 AM

## 2012-03-22 NOTE — Progress Notes (Signed)
Notitfied Dr Arelia Sneddon patient would like to be discharged today.

## 2012-03-22 NOTE — Progress Notes (Signed)
Subjective: Postpartum Day two: Cesarean Delivery Patient reports incisional pain, tolerating PO and no problems voiding.    Objective: Vital signs in last 24 hours: Temp:  [97.8 F (36.6 C)-98.7 F (37.1 C)] 97.8 F (36.6 C) (08/11 0600) Pulse Rate:  [59-74] 59  (08/11 0600) Resp:  [18] 18  (08/11 0600) BP: (102-109)/(52-66) 102/52 mmHg (08/11 0600)  Physical Exam:  General: alert Lochia: appropriate Uterine Fundus: firm Incision: healing well DVT Evaluation: No evidence of DVT seen on physical exam.   Basename 03/21/12 0555 03/20/12 0700  HGB 11.4* 12.0  HCT 35.4* 37.2    Assessment/Plan: Status post Cesarean section. Doing well postoperatively.  Continue current care.  Shanyn Preisler S 03/22/2012, 9:03 AM

## 2012-03-23 ENCOUNTER — Encounter (HOSPITAL_COMMUNITY): Payer: Self-pay | Admitting: Obstetrics and Gynecology

## 2012-03-23 MED FILL — Scopolamine TD Patch 72HR 1 MG/3DAYS: TRANSDERMAL | Qty: 1 | Status: AC

## 2012-10-03 ENCOUNTER — Ambulatory Visit (INDEPENDENT_AMBULATORY_CARE_PROVIDER_SITE_OTHER): Payer: 59 | Admitting: Internal Medicine

## 2012-10-03 VITALS — BP 98/62 | HR 63 | Temp 98.6°F | Resp 17 | Ht 67.0 in | Wt 150.0 lb

## 2012-10-03 DIAGNOSIS — E785 Hyperlipidemia, unspecified: Secondary | ICD-10-CM

## 2012-10-03 DIAGNOSIS — E7800 Pure hypercholesterolemia, unspecified: Secondary | ICD-10-CM | POA: Insufficient documentation

## 2012-10-03 HISTORY — DX: Pure hypercholesterolemia, unspecified: E78.00

## 2012-10-03 NOTE — Progress Notes (Signed)
  Subjective:    Patient ID: Cathy Hamilton, female    DOB: 11-13-1975, 37 y.o.   MRN: [id number]  HPI healthy 37 year old Currently nursing her child Head screening lab showing cholesterol over 350 with LDL 250 range/HDL 92 or above Triglycerides normal Was advised to take medication Her calculated heart risk is less than 3% Family history is negative for heart attacks/mother has high cholesterol but is alive and well  Past medical history no illnesses or medications  Review of Systems Review of systems negative    Objective:   Physical Exam Blood pressure 98/62 heart rate 63 and regular       Assessment & Plan:  Problem #1 high cholesterol with no risk for heart disease  Does not need medication Lifestyle changes 2gm fish oil or flax seed Followup late summer to assess

## 2012-11-12 ENCOUNTER — Encounter (HOSPITAL_COMMUNITY): Payer: Self-pay

## 2012-11-12 ENCOUNTER — Emergency Department (HOSPITAL_COMMUNITY)
Admission: EM | Admit: 2012-11-12 | Discharge: 2012-11-12 | Disposition: A | Discharge status: Home / Self Care | Payer: 59 | Source: Home / Self Care | Attending: Emergency Medicine | Admitting: Emergency Medicine

## 2012-11-12 DIAGNOSIS — N390 Urinary tract infection, site not specified: Secondary | ICD-10-CM

## 2012-11-12 LAB — POCT URINALYSIS DIP (DEVICE)
Bilirubin Urine: NEGATIVE
Glucose, UA: NEGATIVE mg/dL
Ketones, ur: NEGATIVE mg/dL
Nitrite: NEGATIVE
Specific Gravity, Urine: 1.01 (ref 1.005–1.030)

## 2012-11-12 MED ORDER — PHENAZOPYRIDINE HCL 200 MG PO TABS: 200.0000 mg | ORAL_TABLET | Freq: Three times a day (TID) | ORAL | Status: DC | PRN

## 2012-11-12 MED ORDER — CEPHALEXIN 500 MG PO CAPS: 500.0000 mg | ORAL_CAPSULE | Freq: Three times a day (TID) | ORAL | Status: DC

## 2012-11-12 NOTE — ED Notes (Signed)
C/o pain low back, pain in left groin, pain w urination, pain in abdominal area w ambulation ; NAD

## 2012-11-12 NOTE — ED Provider Notes (Signed)
Chief Complaint:   Chief Complaint  Patient presents with  . Back Pain    History of Present Illness:   Cathy Hamilton is a 37 year old female who has had a three-day history of pain in her mid back area, worse on the left than the right, aching all over , dysuria, frequency, headache, and nausea. She denies any fever, vomiting, URI symptoms, cough, abdominal pain, hematuria, or GYN complaints. No prior history of urinary tract infection. She is taking clindamycin for a dental infection. Penicillin caused her to break out in a rash. She is breast-feeding.  Review of Systems:  Other than noted above, the patient denies any of the following symptoms: General:  No fevers, chills, sweats, aches, or fatigue. GI:  No abdominal pain, back pain, nausea, vomiting, diarrhea, or constipation. GU:  No dysuria, frequency, urgency, hematuria, or incontinence. GYN:  No discharge, itching, vulvar pain or lesions, pelvic pain, or abnormal vaginal bleeding.  PMFSH:  Past medical history, family history, social history, meds, and allergies were reviewed.    Physical Exam:   Vital signs:  BP 109/59  Pulse 98  Temp(Src) 99.1 F (37.3 C) (Oral)  Resp 16  SpO2 100% Gen:  Alert, oriented, in no distress. Lungs:  Clear to auscultation, no wheezes, rales or rhonchi. Heart:  Regular rhythm, no gallop or murmer. Abdomen:  Flat and soft. There was slight suprapubic pain to palpation.  No guarding, or rebound.  No hepato-splenomegaly or mass.  Bowel sounds were normally active.  No hernia. Back:  No CVA tenderness.  Skin:  Clear, warm and dry.  Labs:   Results for orders placed during the hospital encounter of 11/12/12  POCT URINALYSIS DIP (DEVICE)      Result Value Range   Glucose, UA NEGATIVE  NEGATIVE mg/dL   Bilirubin Urine NEGATIVE  NEGATIVE   Ketones, ur NEGATIVE  NEGATIVE mg/dL   Specific Gravity, Urine 1.010  1.005 - 1.030   Hgb urine dipstick TRACE (*) NEGATIVE   pH 6.5  5.0 - 8.0   Protein,  ur NEGATIVE  NEGATIVE mg/dL   Urobilinogen, UA 0.2  0.0 - 1.0 mg/dL   Nitrite NEGATIVE  NEGATIVE   Leukocytes, UA NEGATIVE  NEGATIVE  POCT PREGNANCY, URINE      Result Value Range   Preg Test, Ur NEGATIVE  NEGATIVE     Other Labs Obtained at [facility]:  A urine culture was obtained.  Results are pending at this time and we will call about any positive results.  Assessment: The encounter diagnosis was UTI (lower urinary tract infection).   The urine was not very remarkable, yet she has all the symptoms of urinary tract infection. We'll go ahead and treat with cephalexin, pending the results of the urine culture. Cephalexin should be okay for her to take while breast-feeding. She only has one day of clindamycin remaining, so we will only need overlap drugs for one day. She was encouraged to return if she should feel worse in any way particularly with fever, chills, vomiting, or worsening pain.  Plan:   1.  The following meds were prescribed:   Discharge Medication List as of 11/12/2012  3:17 PM     2.  The patient was instructed in symptomatic care and handouts were given. 3.  The patient was told to return if becoming worse in any way, if no better in 3 or 4 days, and given some red flag symptoms such as fever, vomiting, or worsening pain that  would indicate earlier return. 4.  The patient was told to avoid intercourse for 10 days, get extra fluids, and return for a follow up with her primary care doctor at the completion of treatment for a repeat UA and culture.     Reuben Likes, MD 11/12/12 [phone]

## 2012-11-12 NOTE — ED Notes (Signed)
Call back number for lab issues verified 

## 2012-11-13 LAB — URINE CULTURE: Culture: NO GROWTH

## 2013-08-18 ENCOUNTER — Ambulatory Visit: Payer: 59 | Admitting: Internal Medicine

## 2013-10-20 ENCOUNTER — Encounter: Payer: 59 | Admitting: Internal Medicine

## 2013-12-15 ENCOUNTER — Encounter: Payer: Self-pay | Admitting: Internal Medicine

## 2013-12-15 ENCOUNTER — Ambulatory Visit (INDEPENDENT_AMBULATORY_CARE_PROVIDER_SITE_OTHER): Payer: 59 | Admitting: Internal Medicine

## 2013-12-15 VITALS — BP 111/58 | HR 86 | Temp 98.4°F | Resp 16 | Ht 66.5 in | Wt 135.0 lb

## 2013-12-15 DIAGNOSIS — E785 Hyperlipidemia, unspecified: Secondary | ICD-10-CM

## 2013-12-15 DIAGNOSIS — Z0000 Encounter for general adult medical examination without abnormal findings: Secondary | ICD-10-CM

## 2013-12-15 LAB — CBC WITH DIFFERENTIAL/PLATELET
BASOS ABS: 0 10*3/uL (ref 0.0–0.1)
Basophils Relative: 0 % (ref 0–1)
EOS PCT: 0 % (ref 0–5)
Eosinophils Absolute: 0 10*3/uL (ref 0.0–0.7)
HEMATOCRIT: 40.7 % (ref 36.0–46.0)
Hemoglobin: 13.4 g/dL (ref 12.0–15.0)
LYMPHS ABS: 1.3 10*3/uL (ref 0.7–4.0)
LYMPHS PCT: 27 % (ref 12–46)
MCH: 26 pg (ref 26.0–34.0)
MCHC: 32.9 g/dL (ref 30.0–36.0)
MCV: 78.9 fL (ref 78.0–100.0)
MONO ABS: 0.4 10*3/uL (ref 0.1–1.0)
Monocytes Relative: 9 % (ref 3–12)
NEUTROS ABS: 3.1 10*3/uL (ref 1.7–7.7)
Neutrophils Relative %: 64 % (ref 43–77)
PLATELETS: 233 10*3/uL (ref 150–400)
RBC: 5.16 MIL/uL — AB (ref 3.87–5.11)
RDW: 14.2 % (ref 11.5–15.5)
WBC: 4.9 10*3/uL (ref 4.0–10.5)

## 2013-12-15 NOTE — Progress Notes (Signed)
   Subjective:    Patient ID: Cathy Hamilton, female    DOB: 17-Jul-1976, 38 y.o.   MRN: [id number]  HPI    Review of Systems  HENT: Positive for rhinorrhea and sneezing.   Gastrointestinal: Positive for abdominal pain.  Genitourinary: Positive for urgency.  Neurological: Positive for light-headedness.       Objective:   Physical Exam        Assessment & Plan:

## 2013-12-15 NOTE — Progress Notes (Signed)
Subjective:    Patient ID: Cathy Hamilton, female    DOB: 1976-02-12, 38 y.o.   MRN: [id number] This chart was scribed for Forman. Laney Pastor, MD by Terressa Koyanagi, ED Scribe. This patient was seen in room 25 and the patient's care was started at 11:56 AM.    HPI HPI Comments: Cathy Hamilton is a 38 y.o. female, with a history of elevated cholesterol and cesarean delivery, who presents to the [facility] with multiple complaints.   First Complaint: Pt complains that since her last cesarean she lacks the urge to urinate. Pt reports seeing an urologist who suggested a Cystoscopy, pt reports she has not had the Cystoscopy yet. Pt also reports that there was an ultrasound done which did not show any problems with voiding the bladder. Pt reports one urinary infection in the past year. Pt denies dysuria.  Second Complaint: Pt reports that she had a pap smear recently and is continuing Tdap as the form of birthcontrol because she has difficulty remembering to take a pill every day.   Third Complaint: Pt also complains of twitching of both of her eyes onset two weeks ago. Pt denies trouble sleeping or visual disturbances. 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 Pt further reports that she has seasonal allergies.   Fourth Complaint: Pt complains that she feels intermittent pain and discomfort in her upper abd, bilaterally. Pt reports that the episodes of the pain is aggravated by twisting. Pt denies the episodes are brought on by eating. Further, pt denies doing any new abd exercises or any new physical activity in general.   Pt reports her mother is 11 years old and has DM, had several surgeries, and is overweight. Pt reports her father is deceased and the cause of death was:  alcoholism, HTN, stroke.   immun utd Gyn utd  Review of Systems  Eyes: Negative for visual disturbance.       Eye twitching    Gastrointestinal: Positive for abdominal pain.  Genitourinary: Positive for difficulty urinating. Negative for dysuria, urgency and frequency.  Musculoskeletal: Negative for back pain.    Objective:   Physical Exam  Constitutional: She is oriented to person, place, and time. She appears well-developed and well-nourished. No distress.  HENT:  Head: Normocephalic.  Right Ear: External ear normal.  Left Ear: External ear normal.  Nose: Nose normal.  Mouth/Throat: Oropharynx is clear and moist.  Eyes: Conjunctivae are normal. Pupils are equal, round, and reactive to light. No scleral icterus.  Neck: Normal range of motion. Neck supple. No thyromegaly present.  Cardiovascular: Normal rate and regular rhythm.  Exam reveals no gallop and no friction rub.   No murmur heard. Pulmonary/Chest: Effort normal and breath sounds normal. No respiratory distress. She has no wheezes. She has no rales.  Floating ribs bilaterally which are slightly tender  Abdominal: Soft. Bowel sounds are normal. She exhibits no distension. There is no tenderness. There is no rebound.  No hepatoemagly.   Musculoskeletal: Normal range of motion. She exhibits no edema.  Neurological: She is alert and oriented to person, place, and time.  Skin: Skin is warm and dry. No rash noted.  Psychiatric: She has a normal mood and affect. Her behavior is normal.    Assessment & Plan:  DIAGNOSTIC STUDIES: Oxygen Saturation is 99% on RA, normal by my interpretation.    COORDINATION OF CARE: 12:12 PM-Discussed treatment plan  which includes labs with pt at bedside and pt agreed to plan.   I have completed the patient encounter in its entirety as documented by the scribe, with editing by me where necessary. Aramis Zobel P. Laney Pastor, M.D. Routine general medical examination at a health care facility  Other and unspecified hyperlipidemia - Plan: CBC with Differential, Comprehensive metabolic panel, Lipid panel Eye  twitching--reassured Abdominal complaints w/out clear etio--miralax and follow  Await labs before disposition ?rx lipids

## 2013-12-16 LAB — COMPREHENSIVE METABOLIC PANEL
ALBUMIN: 4.1 g/dL (ref 3.5–5.2)
ALT: 12 U/L (ref 0–35)
AST: 15 U/L (ref 0–37)
Alkaline Phosphatase: 38 U/L — ABNORMAL LOW (ref 39–117)
BUN: 12 mg/dL (ref 6–23)
CALCIUM: 8.9 mg/dL (ref 8.4–10.5)
CHLORIDE: 103 meq/L (ref 96–112)
CO2: 26 meq/L (ref 19–32)
Creat: 0.71 mg/dL (ref 0.50–1.10)
Glucose, Bld: 79 mg/dL (ref 70–99)
POTASSIUM: 3.8 meq/L (ref 3.5–5.3)
Sodium: 138 mEq/L (ref 135–145)
Total Bilirubin: 0.9 mg/dL (ref 0.2–1.2)
Total Protein: 6.5 g/dL (ref 6.0–8.3)

## 2013-12-16 LAB — LIPID PANEL
CHOL/HDL RATIO: 4.7 ratio
Cholesterol: 213 mg/dL — ABNORMAL HIGH (ref 0–200)
HDL: 45 mg/dL (ref >39)
LDL CALC: 158 mg/dL — AB (ref 0–99)
Triglycerides: 51 mg/dL (ref <150)
VLDL: 10 mg/dL (ref 0–40)

## 2013-12-29 ENCOUNTER — Encounter: Payer: Self-pay | Admitting: Internal Medicine

## 2014-04-27 ENCOUNTER — Ambulatory Visit (INDEPENDENT_AMBULATORY_CARE_PROVIDER_SITE_OTHER): Payer: 59 | Admitting: Internal Medicine

## 2014-04-27 VITALS — BP 101/58 | HR 76 | Temp 97.7°F | Resp 16 | Ht 66.5 in | Wt 141.0 lb

## 2014-04-27 DIAGNOSIS — R0789 Other chest pain: Secondary | ICD-10-CM

## 2014-04-27 DIAGNOSIS — R071 Chest pain on breathing: Secondary | ICD-10-CM

## 2014-04-27 NOTE — Progress Notes (Signed)
   Subjective:    Patient ID: Cathy Hamilton, female    DOB: 01-04-1976, 38 y.o.   MRN: [id number]  HPI Recurrent RUQ pain Starts ?why Hurts with movement/rollover Very intermittent over 3-5 mos Cleansing-working out/more active Activity does not seem to precipitate pain but can make it worse No nausea vomiting diarrhea or constipation No reflux symptoms No difficulty breathing No fever chills or night sweats No dyspepsia or reflux  Patient Active Problem List   Diagnosis Date Noted  . Elevated cholesterol 10/03/2012  . Previous cesarean delivery affecting pregnancy 03/21/2012    Class: Present on Admission  . S/P cesarean section 03/21/2012    Class: Status post   Prior to Admission medications   Medication Sig Start Date End Date Taking? Authorizing Provider  ORTHO TRI-CYCLEN LO 0.18/0.215/0.25 MG-25 MCG tab  12/08/13  Yes Historical Provider, MD  Prenatal Vit-Fe Fumarate-FA (PRENATAL MULTIVITAMIN) TABS Take 1 tablet by mouth daily.   Yes Historical Provider, MD    Review of Systems  Constitutional: Negative for fever, activity change, fatigue and unexpected weight change.  Respiratory: Negative for cough, chest tightness, shortness of breath and wheezing.   Cardiovascular: Negative for chest pain, palpitations and leg swelling.  Gastrointestinal: Negative for nausea and vomiting.  Genitourinary: Negative for frequency, difficulty urinating and pelvic pain.       Objective:   Physical Exam BP 101/58  Pulse 76  Temp(Src) 97.7 F (36.5 C)  Resp 16  Ht 5' 6.5" (1.689 m)  Wt 141 lb (63.957 kg)  BMI 22.42 kg/m2  SpO2 100% HEENT clear Heart regular without murmur Neck range of motion full/no cervical adenopathy or thyromegaly Chest clear to auscultation She is tender to palpation over the right lower rib margin in the anterior axillary line Without defect or swelling The abdomen is soft nontender nondistended without organomegaly or masses Extremities  clear       Assessment & Plan:  Chest wall pain  She is advised to increase her flexibility scheduling to daily exercises and to see if lifting her children is possibly part of this etiology//okay to use heat or ice or NSAIDs Followup if not improving or 3 months

## 2014-05-20 ENCOUNTER — Other Ambulatory Visit: Payer: Self-pay | Admitting: Obstetrics and Gynecology

## 2014-05-20 DIAGNOSIS — D242 Benign neoplasm of left breast: Secondary | ICD-10-CM

## 2014-05-27 ENCOUNTER — Other Ambulatory Visit: Payer: 59

## 2014-06-02 ENCOUNTER — Ambulatory Visit
Admission: RE | Admit: 2014-06-02 | Discharge: 2014-06-02 | Disposition: A | Discharge status: Home / Self Care | Payer: 59 | Source: Ambulatory Visit | Attending: Obstetrics and Gynecology | Admitting: Obstetrics and Gynecology

## 2014-06-02 ENCOUNTER — Encounter (INDEPENDENT_AMBULATORY_CARE_PROVIDER_SITE_OTHER): Payer: Self-pay

## 2014-06-02 DIAGNOSIS — D242 Benign neoplasm of left breast: Secondary | ICD-10-CM

## 2014-06-13 ENCOUNTER — Encounter: Payer: Self-pay | Admitting: Internal Medicine

## 2015-01-14 IMAGING — US US BREAST*L* LIMITED INC AXILLA
1 series · 5 of 5 positions shown · non-contrast
Comparison: 09/20/2011 and 08/19/2005

CLINICAL DATA: Followup of left breast fibroadenoma on was
requested. The patient does not have any new areas of concern. The
fibroadenoma was initially evaluated in Friday August, 2005 and last
evaluated in September 2011. At that time, given its stability, no
further imaging followup was suggested of the fibroadenoma. The
patient has 3 children, the youngest is 2 years old.

EXAM:
ULTRASOUND OF THE LEFT BREAST

[Series 1: superficial breast · 5 of 5 slices shown]
[im 1/5]
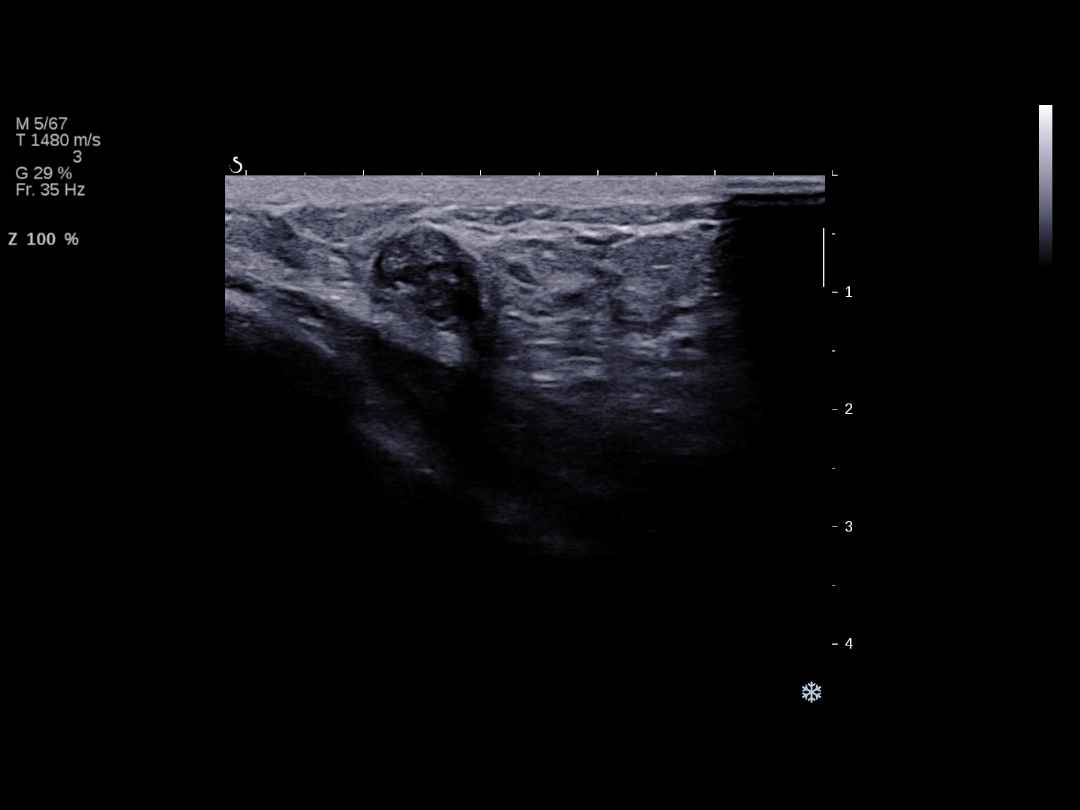
[im 2/5]
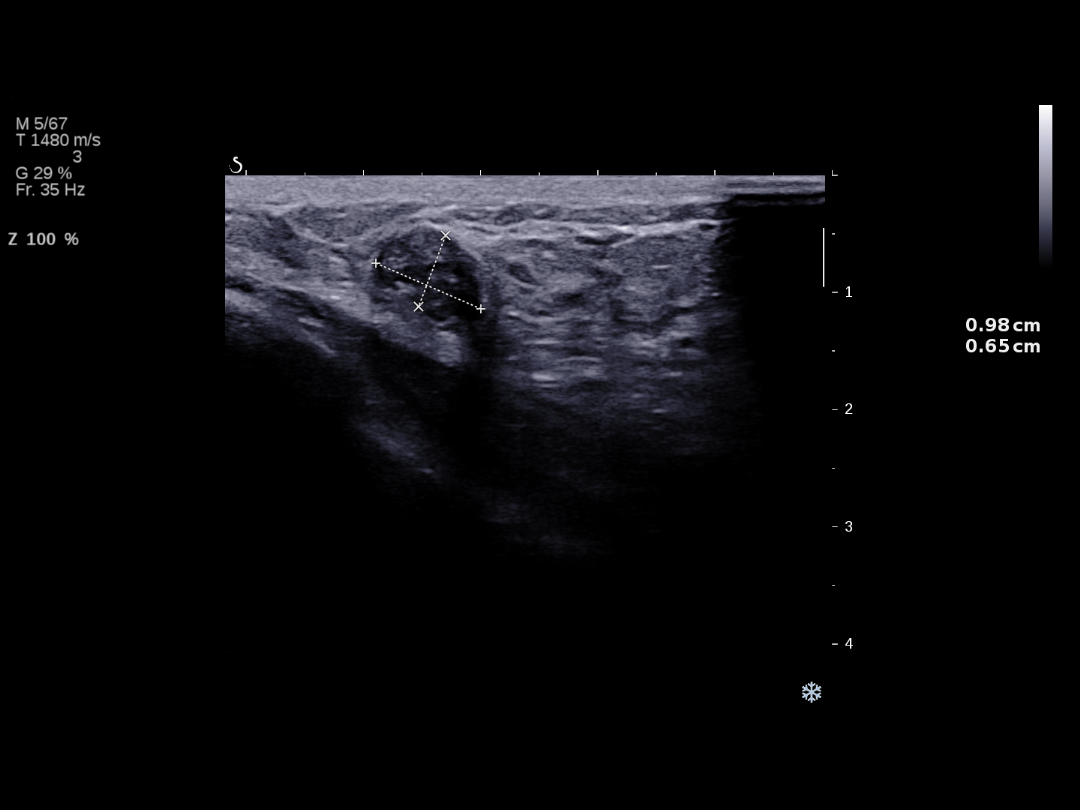
[im 3/5]
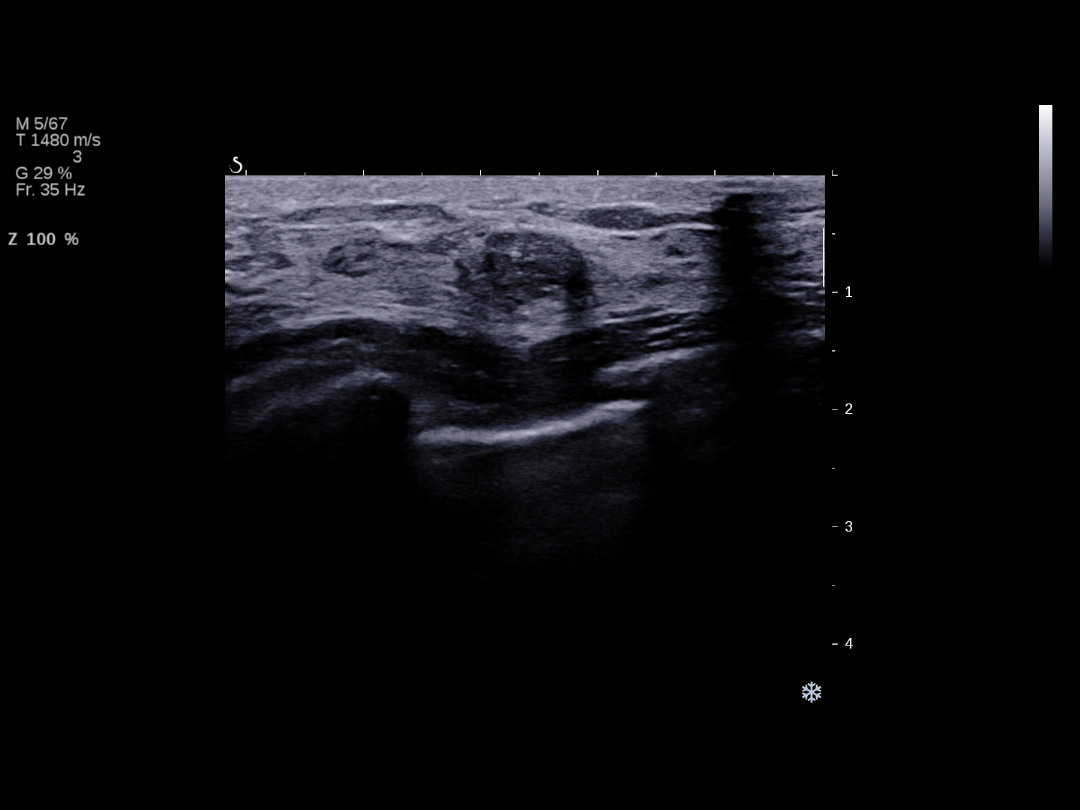
[im 4/5]
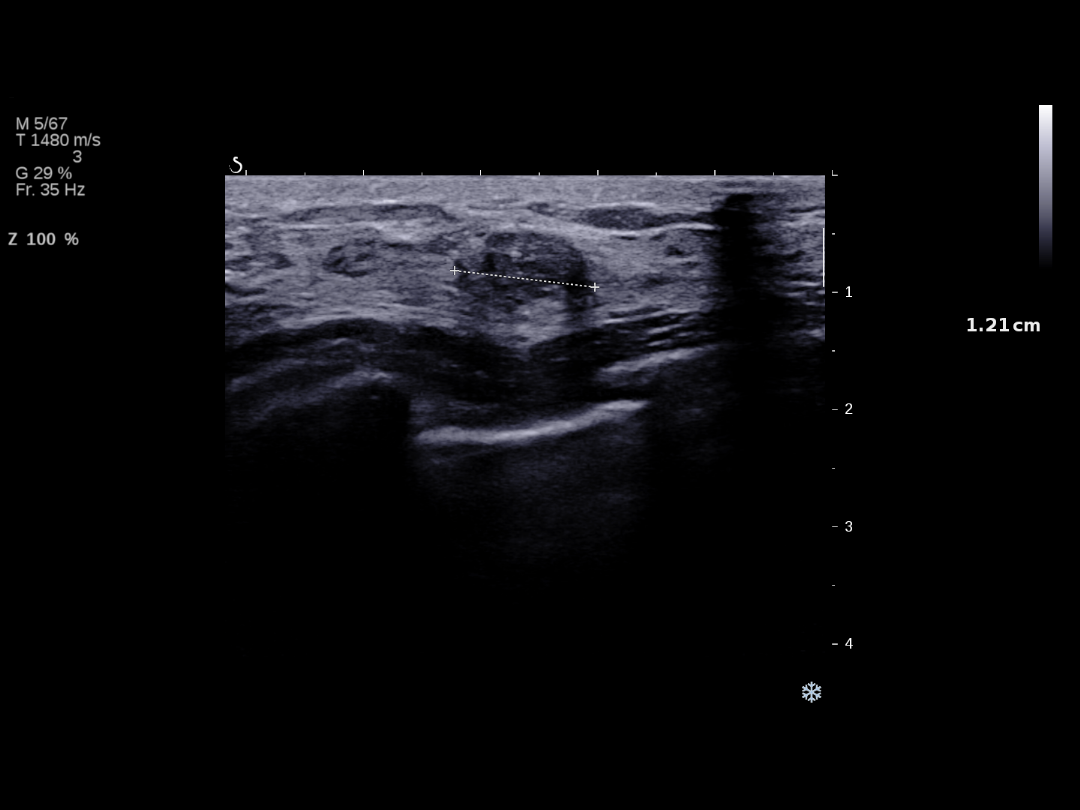
[im 5/5]
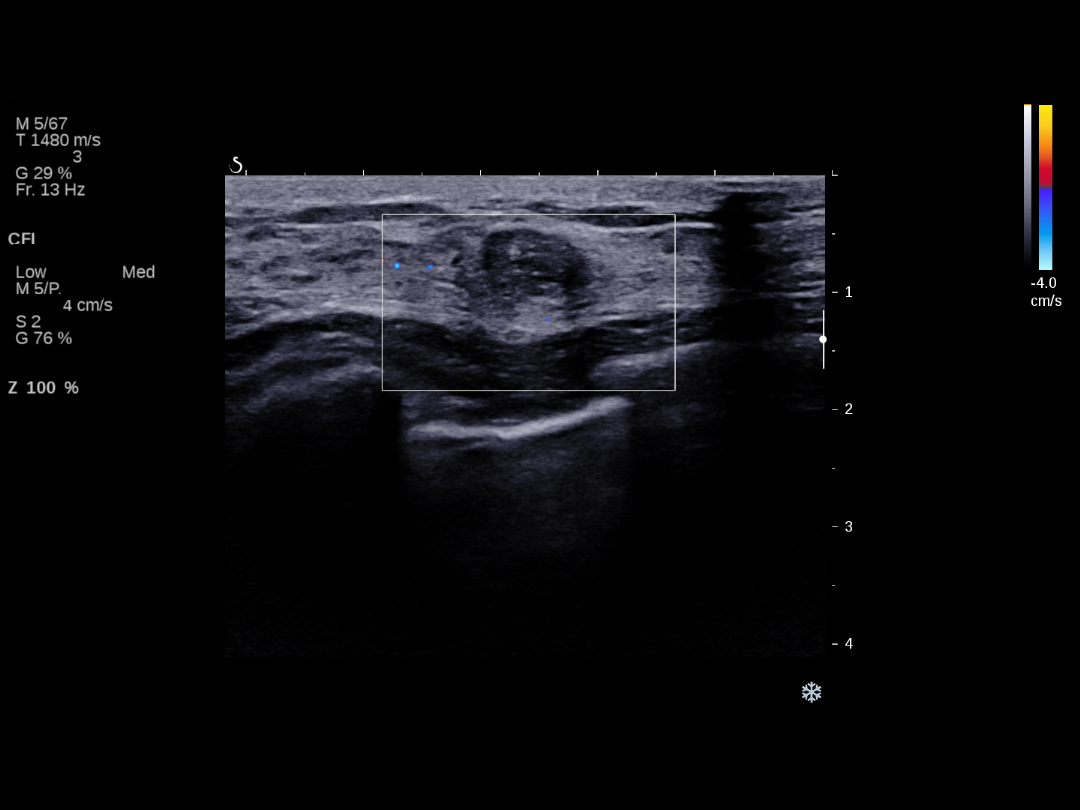

[5 of 5 positions shown; findings below may reference images not displayed]

FINDINGS: On physical exam, I palpate a small mobile mass in the 7 o'clock
position left breast 7 cm from the nipple.

Ultrasound is performed, showing a circumscribed hypoechoic oval
mass with internal echogenic bands at 7 o'clock position 7 cm from
nipple. The mass currently measures 1.2 x 1.0 x 0.7 cm. The mass has
decreased in size since she was last evaluated an 09/20/2011 (was
2.2 x 1.0 x 1.8 cm at that time). No new masses in the inferior left
breast.
IMPRESSION: Interval decrease in size of left breast fibroadenoma. No new mass
is identified in the inferior left breast.

RECOMMENDATION:
Screening mammogram at age 41 unless there are persistent or
intervening clinical concerns. (Code:VB-S-M30)

I have discussed the findings and recommendations with the patient.
Results were also provided in writing at the conclusion of the
visit. If applicable, a reminder letter will be sent to the patient
regarding the next appointment.

BI-RADS CATEGORY  2: Benign.

## 2015-08-02 ENCOUNTER — Encounter: Payer: Self-pay | Admitting: Internal Medicine

## 2015-08-02 ENCOUNTER — Ambulatory Visit (INDEPENDENT_AMBULATORY_CARE_PROVIDER_SITE_OTHER): Payer: BLUE CROSS/BLUE SHIELD | Admitting: Internal Medicine

## 2015-08-02 VITALS — BP 101/64 | HR 88 | Temp 98.2°F | Resp 16 | Ht 67.0 in | Wt 151.0 lb

## 2015-08-02 DIAGNOSIS — Z0000 Encounter for general adult medical examination without abnormal findings: Secondary | ICD-10-CM

## 2015-08-02 DIAGNOSIS — E7800 Pure hypercholesterolemia, unspecified: Secondary | ICD-10-CM

## 2015-08-02 DIAGNOSIS — J0100 Acute maxillary sinusitis, unspecified: Secondary | ICD-10-CM | POA: Diagnosis not present

## 2015-08-02 DIAGNOSIS — J302 Other seasonal allergic rhinitis: Secondary | ICD-10-CM

## 2015-08-02 LAB — CBC WITH DIFFERENTIAL/PLATELET
BASOS PCT: 1 % (ref 0–1)
Basophils Absolute: 0 10*3/uL (ref 0.0–0.1)
Eosinophils Absolute: 0.2 10*3/uL (ref 0.0–0.7)
Eosinophils Relative: 4 % (ref 0–5)
HEMATOCRIT: 41.9 % (ref 36.0–46.0)
HEMOGLOBIN: 13.6 g/dL (ref 12.0–15.0)
LYMPHS PCT: 31 % (ref 12–46)
Lymphs Abs: 1.2 10*3/uL (ref 0.7–4.0)
MCH: 26.4 pg (ref 26.0–34.0)
MCHC: 32.5 g/dL (ref 30.0–36.0)
MCV: 81.4 fL (ref 78.0–100.0)
MONO ABS: 0.5 10*3/uL (ref 0.1–1.0)
MPV: 10.8 fL (ref 8.6–12.4)
Monocytes Relative: 14 % — ABNORMAL HIGH (ref 3–12)
NEUTROS ABS: 1.9 10*3/uL (ref 1.7–7.7)
NEUTROS PCT: 50 % (ref 43–77)
Platelets: 212 10*3/uL (ref 150–400)
RBC: 5.15 MIL/uL — ABNORMAL HIGH (ref 3.87–5.11)
RDW: 14.4 % (ref 11.5–15.5)
WBC: 3.8 10*3/uL — ABNORMAL LOW (ref 4.0–10.5)

## 2015-08-02 MED ORDER — AZITHROMYCIN 250 MG PO TABS: ORAL_TABLET | ORAL | Status: DC

## 2015-08-02 MED ORDER — FLUTICASONE PROPIONATE 50 MCG/ACT NA SUSP: NASAL | Status: DC

## 2015-08-02 MED ORDER — CETIRIZINE-PSEUDOEPHEDRINE ER 5-120 MG PO TB12: 1.0000 | ORAL_TABLET | Freq: Two times a day (BID) | ORAL | Status: DC

## 2015-08-02 NOTE — Progress Notes (Signed)
Subjective:    Patient ID: Cathy Hamilton, female    DOB: Jul 18, 1976, 39 y.o.   MRN: [id number]  HPIhealthy 39yo here for annual PE LDL 150s in past/otherwise healthy HM UTD  Married 3 kids-may be moving w/ husb new job  Review of Systems 14pt neg x nasel congestion for 2 month--unclear if started as allergic PND plus occas cough-nonprod Affects breathing at night but not exercise No night sweats   past week worse with yellow d/c Objective:   Physical Exam  Constitutional: She is oriented to person, place, and time. She appears well-developed and well-nourished. No distress.  HENT:  Head: Normocephalic.  Right Ear: External ear normal.  Left Ear: External ear normal.  Mouth/Throat: Oropharynx is clear and moist.  turbs boggy,wet  Eyes: Conjunctivae and EOM are normal. Pupils are equal, round, and reactive to light.  Neck: Normal range of motion. Neck supple. No thyromegaly present.  Cardiovascular: Normal rate, regular rhythm, normal heart sounds and intact distal pulses.   No murmur heard. Pulmonary/Chest: Effort normal and breath sounds normal. She has no wheezes.  Abdominal: Soft. Bowel sounds are normal. She exhibits no distension and no mass. There is no tenderness. There is no rebound.  Musculoskeletal: Normal range of motion. She exhibits no edema or tenderness.  Lymphadenopathy:    She has no cervical adenopathy.  Neurological: She is alert and oriented to person, place, and time. She has normal reflexes. No cranial nerve deficit.  Skin: Skin is warm and dry. No rash noted.  Psychiatric: She has a normal mood and affect. Her behavior is normal. Judgment and thought content normal.  Nursing note and vitals reviewed. BP 101/64 mmHg  Pulse 88  Temp(Src) 98.2 F (36.8 C)  Resp 16  Ht _0  (1.702 m)  Wt 151 lb (68.493 kg)  BMI 23.64 kg/m2  LMP 07/27/2015       Assessment & Plan:  Annual physical exam - Plan: CBC with Differential/Platelet, Comprehensive  metabolic panel  Elevated cholesterol - Plan: Lipid panel --LDL 213//start meds-lip 20 ck 3-93mo  Acute maxillary sinusitis, recurrence not specified Due to seasonal allergic rhinitis -see rx below  Results for orders placed or performed in visit on 08/02/15  CBC with Differential/Platelet  Result Value Ref Range   WBC 3.8 (L) 4.0 - 10.5 K/uL   RBC 5.15 (H) 3.87 - 5.11 MIL/uL   Hemoglobin 13.6 12.0 - 15.0 g/dL   HCT 41.9 36.0 - 46.0 %   MCV 81.4 78.0 - 100.0 fL   MCH 26.4 26.0 - 34.0 pg   MCHC 32.5 30.0 - 36.0 g/dL   RDW 14.4 11.5 - 15.5 %   Platelets 212 150 - 400 K/uL   MPV 10.8 8.6 - 12.4 fL   Neutrophils Relative % 50 43 - 77 %   Neutro Abs 1.9 1.7 - 7.7 K/uL   Lymphocytes Relative 31 12 - 46 %   Lymphs Abs 1.2 0.7 - 4.0 K/uL   Monocytes Relative 14 (H) 3 - 12 %   Monocytes Absolute 0.5 0.1 - 1.0 K/uL   Eosinophils Relative 4 0 - 5 %   Eosinophils Absolute 0.2 0.0 - 0.7 K/uL   Basophils Relative 1 0 - 1 %   Basophils Absolute 0.0 0.0 - 0.1 K/uL   Smear Review Criteria for review not met   Comprehensive metabolic panel  Result Value Ref Range   Sodium 140 135 - 146 mmol/L   Potassium 4.2 3.5 - 5.3 mmol/L  Chloride 103 98 - 110 mmol/L   CO2 30 20 - 31 mmol/L   Glucose, Bld 92 65 - 99 mg/dL   BUN 9 7 - 25 mg/dL   Creat 0.68 0.50 - 1.10 mg/dL   Total Bilirubin 0.4 0.2 - 1.2 mg/dL   Alkaline Phosphatase 51 33 - 115 U/L   AST 19 10 - 30 U/L   ALT 19 6 - 29 U/L   Total Protein 6.7 6.1 - 8.1 g/dL   Albumin 4.0 3.6 - 5.1 g/dL   Calcium 8.8 8.6 - 10.2 mg/dL  Lipid panel  Result Value Ref Range   Cholesterol 293 (H) 125 - 200 mg/dL   Triglycerides 65 <150 mg/dL   HDL 67 >=46 mg/dL   Total CHOL/HDL Ratio 4.4 <=5.0 Ratio   VLDL 13 <30 mg/dL   LDL Cholesterol 213 (H) <130 mg/dL   Meds ordered this encounter  Medications  . cetirizine-pseudoephedrine (ZYRTEC-D) 5-120 MG tablet    Sig: Take 1 tablet by mouth 2 (two) times daily.    Dispense:  60 tablet    Refill:   1  . azithromycin (ZITHROMAX) 250 MG tablet    Sig: As packaged    Dispense:  6 tablet    Refill:  0  . fluticasone (FLONASE) 50 MCG/ACT nasal spray    Sig: 1 spray each nostril twice a day    Dispense:  16 g    Refill:  11  . atorvastatin (LIPITOR) 20 MG tablet    Sig: Take 1 tablet (20 mg total) by mouth daily.    Dispense:  90 tablet    Refill:  3

## 2015-08-03 ENCOUNTER — Encounter: Payer: Self-pay | Admitting: Internal Medicine

## 2015-08-03 LAB — COMPREHENSIVE METABOLIC PANEL
ALBUMIN: 4 g/dL (ref 3.6–5.1)
ALT: 19 U/L (ref 6–29)
AST: 19 U/L (ref 10–30)
Alkaline Phosphatase: 51 U/L (ref 33–115)
BILIRUBIN TOTAL: 0.4 mg/dL (ref 0.2–1.2)
BUN: 9 mg/dL (ref 7–25)
CO2: 30 mmol/L (ref 20–31)
CREATININE: 0.68 mg/dL (ref 0.50–1.10)
Calcium: 8.8 mg/dL (ref 8.6–10.2)
Chloride: 103 mmol/L (ref 98–110)
GLUCOSE: 92 mg/dL (ref 65–99)
Potassium: 4.2 mmol/L (ref 3.5–5.3)
SODIUM: 140 mmol/L (ref 135–146)
Total Protein: 6.7 g/dL (ref 6.1–8.1)

## 2015-08-03 LAB — LIPID PANEL
Cholesterol: 293 mg/dL — ABNORMAL HIGH (ref 125–200)
HDL: 67 mg/dL (ref >=46)
LDL CALC: 213 mg/dL — AB (ref <130)
Total CHOL/HDL Ratio: 4.4 Ratio (ref <=5.0)
Triglycerides: 65 mg/dL (ref <150)
VLDL: 13 mg/dL (ref <30)

## 2015-08-03 MED ORDER — ATORVASTATIN CALCIUM 20 MG PO TABS: 20.0000 mg | ORAL_TABLET | Freq: Every day | ORAL | Status: DC

## 2017-01-10 DIAGNOSIS — G8929 Other chronic pain: Secondary | ICD-10-CM

## 2017-01-10 DIAGNOSIS — R519 Headache, unspecified: Secondary | ICD-10-CM

## 2017-01-10 DIAGNOSIS — R109 Unspecified abdominal pain: Secondary | ICD-10-CM

## 2017-01-10 HISTORY — DX: Headache, unspecified: R51.9

## 2017-01-10 HISTORY — DX: Other chronic pain: G89.29

## 2017-01-10 HISTORY — DX: Unspecified abdominal pain: R10.9

## 2018-01-23 DIAGNOSIS — R600 Localized edema: Secondary | ICD-10-CM | POA: Insufficient documentation

## 2018-01-23 DIAGNOSIS — R609 Edema, unspecified: Secondary | ICD-10-CM

## 2018-01-23 HISTORY — DX: Edema, unspecified: R60.9

## 2018-01-23 HISTORY — DX: Localized edema: R60.0

## 2018-09-11 DIAGNOSIS — R0789 Other chest pain: Secondary | ICD-10-CM | POA: Insufficient documentation

## 2018-09-11 HISTORY — DX: Other chest pain: R07.89

## 2019-04-01 DIAGNOSIS — R6884 Jaw pain: Secondary | ICD-10-CM

## 2019-04-01 DIAGNOSIS — M542 Cervicalgia: Secondary | ICD-10-CM

## 2019-04-01 HISTORY — DX: Cervicalgia: M54.2

## 2019-04-01 HISTORY — DX: Jaw pain: R68.84

## 2019-08-16 DIAGNOSIS — M5416 Radiculopathy, lumbar region: Secondary | ICD-10-CM

## 2019-08-16 DIAGNOSIS — M5412 Radiculopathy, cervical region: Secondary | ICD-10-CM

## 2019-08-16 HISTORY — DX: Radiculopathy, lumbar region: M54.16

## 2019-08-16 HISTORY — DX: Radiculopathy, cervical region: M54.12

## 2019-11-22 ENCOUNTER — Other Ambulatory Visit: Payer: Self-pay

## 2019-11-23 ENCOUNTER — Encounter: Payer: Self-pay | Admitting: Family Medicine

## 2019-11-23 ENCOUNTER — Ambulatory Visit (INDEPENDENT_AMBULATORY_CARE_PROVIDER_SITE_OTHER): Payer: 59 | Admitting: Family Medicine

## 2019-11-23 VITALS — BP 108/68 | HR 89 | Temp 98.2°F | Ht 67.0 in | Wt 159.6 lb

## 2019-11-23 DIAGNOSIS — Z0001 Encounter for general adult medical examination with abnormal findings: Secondary | ICD-10-CM | POA: Diagnosis not present

## 2019-11-23 DIAGNOSIS — Z0000 Encounter for general adult medical examination without abnormal findings: Secondary | ICD-10-CM | POA: Insufficient documentation

## 2019-11-23 DIAGNOSIS — R253 Fasciculation: Secondary | ICD-10-CM

## 2019-11-23 DIAGNOSIS — Z8739 Personal history of other diseases of the musculoskeletal system and connective tissue: Secondary | ICD-10-CM

## 2019-11-23 DIAGNOSIS — E7800 Pure hypercholesterolemia, unspecified: Secondary | ICD-10-CM

## 2019-11-23 DIAGNOSIS — H938X9 Other specified disorders of ear, unspecified ear: Secondary | ICD-10-CM

## 2019-11-23 HISTORY — DX: Personal history of other diseases of the musculoskeletal system and connective tissue: Z87.39

## 2019-11-23 HISTORY — DX: Fasciculation: R25.3

## 2019-11-23 HISTORY — DX: Encounter for general adult medical examination without abnormal findings: Z00.00

## 2019-11-23 NOTE — Patient Instructions (Signed)
Health Maintenance, Female Adopting a healthy lifestyle and getting preventive care are important in promoting health and wellness. Ask your health care provider about:  The right schedule for you to have regular tests and exams.  Things you can do on your own to prevent diseases and keep yourself healthy. What should I know about diet, weight, and exercise? Eat a healthy diet   Eat a diet that includes plenty of vegetables, fruits, low-fat dairy products, and lean protein.  Do not eat a lot of foods that are high in solid fats, added sugars, or sodium. Maintain a healthy weight Body mass index (BMI) is used to identify weight problems. It estimates body fat based on height and weight. Your health care provider can help determine your BMI and help you achieve or maintain a healthy weight. Get regular exercise Get regular exercise. This is one of the most important things you can do for your health. Most adults should:  Exercise for at least 150 minutes each week. The exercise should increase your heart rate and make you sweat (moderate-intensity exercise).  Do strengthening exercises at least twice a week. This is in addition to the moderate-intensity exercise.  Spend less time sitting. Even light physical activity can be beneficial. Watch cholesterol and blood lipids Have your blood tested for lipids and cholesterol at 43 years of age, then have this test every 5 years. Have your cholesterol levels checked more often if:  Your lipid or cholesterol levels are high.  You are older than 43 years of age.  You are at high risk for heart disease. What should I know about cancer screening? Depending on your health history and family history, you may need to have cancer screening at various ages. This may include screening for:  Breast cancer.  Cervical cancer.  Colorectal cancer.  Skin cancer.  Lung cancer. What should I know about heart disease, diabetes, and high blood  pressure? Blood pressure and heart disease  High blood pressure causes heart disease and increases the risk of stroke. This is more likely to develop in people who have high blood pressure readings, are of African descent, or are overweight.  Have your blood pressure checked: ? Every 3-5 years if you are 54-62 years of age. ? Every year if you are 93 years old or older. Diabetes Have regular diabetes screenings. This checks your fasting blood sugar level. Have the screening done:  Once every three years after age 69 if you are at a normal weight and have a low risk for diabetes.  More often and at a younger age if you are overweight or have a high risk for diabetes. What should I know about preventing infection? Hepatitis B If you have a higher risk for hepatitis B, you should be screened for this virus. Talk with your health care provider to find out if you are at risk for hepatitis B infection. Hepatitis C Testing is recommended for:  Everyone born from [birth year] through 1964.  Anyone with known risk factors for hepatitis C. Sexually transmitted infections (STIs)  Get screened for STIs, including gonorrhea and chlamydia, if: ? You are sexually active and are younger than 43 years of age. ? You are older than 43 years of age and your health care provider tells you that you are at risk for this type of infection. ? Your sexual activity has changed since you were last screened, and you are at increased risk for chlamydia or gonorrhea. Ask your health care provider if  you are at risk. °· Ask your health care provider about whether you are at high risk for HIV. Your health care provider may recommend a prescription medicine to help prevent HIV infection. If you choose to take medicine to prevent HIV, you should first get tested for HIV. You should then be tested every 3 months for as long as you are taking the medicine. °Pregnancy °· If you are about to stop having your period (premenopausal) and  you may become pregnant, seek counseling before you get pregnant. °· Take 400 to 800 micrograms (mcg) of folic acid every day if you become pregnant. °· Ask for birth control (contraception) if you want to prevent pregnancy. °Osteoporosis and menopause °Osteoporosis is a disease in which the bones lose minerals and strength with aging. This can result in bone fractures. If you are 65 years old or older, or if you are at risk for osteoporosis and fractures, ask your health care provider if you should: °· Be screened for bone loss. °· Take a calcium or vitamin D supplement to lower your risk of fractures. °· Be given hormone replacement therapy (HRT) to treat symptoms of menopause. °Follow these instructions at home: °Lifestyle °· Do not use any products that contain nicotine or tobacco, such as cigarettes, e-cigarettes, and chewing tobacco. If you need help quitting, ask your health care provider. °· Do not use street drugs. °· Do not share needles. °· Ask your health care provider for help if you need support or information about quitting drugs. °Alcohol use °· Do not drink alcohol if: °? Your health care provider tells you not to drink. °? You are pregnant, may be pregnant, or are planning to become pregnant. °· If you drink alcohol: °? Limit how much you use to 0-1 drink a day. °? Limit intake if you are breastfeeding. °· Be aware of how much alcohol is in your drink. In the [place] one drink equals one 12 oz bottle of beer (355 mL), one 5 oz glass of wine (148 mL), or one 1½ oz glass of hard liquor (44 mL). °General instructions °· Schedule regular health, dental, and eye exams. °· Stay current with your vaccines. °· Tell your health care provider if: °? You often feel depressed. °? You have ever been abused or do not feel safe at home. °Summary °· Adopting a healthy lifestyle and getting preventive care are important in promoting health and wellness. °· Follow your health care provider's instructions about healthy  diet, exercising, and getting tested or screened for diseases. °· Follow your health care provider's instructions on monitoring your cholesterol and blood pressure. °This information is not intended to replace advice given to you by your health care provider. Make sure you discuss any questions you have with your health care provider. °Document Revised: 07/22/2018 Document Reviewed: 07/22/2018 °Elsevier Patient Education © 2020 [employer] ° °Preventive Care 40-64 Years Old, Female °Preventive care refers to visits with your health care provider and lifestyle choices that can promote health and wellness. This includes: °· A yearly physical exam. This may also be called an annual well check. °· Regular dental visits and eye exams. °· Immunizations. °· Screening for certain conditions. °· Healthy lifestyle choices, such as eating a healthy diet, getting regular exercise, not using drugs or products that contain nicotine and tobacco, and limiting alcohol use. °What can I expect for my preventive care visit? °Physical exam °Your health care provider will check your: °· Height and weight. This may be used   to calculate body mass index (BMI), which tells if you are at a healthy weight. °· Heart rate and blood pressure. °· Skin for abnormal spots. °Counseling °Your health care provider may ask you questions about your: °· Alcohol, tobacco, and drug use. °· Emotional well-being. °· Home and relationship well-being. °· Sexual activity. °· Eating habits. °· Work and work environment. °· Method of birth control. °· Menstrual cycle. °· Pregnancy history. °What immunizations do I need? ° °Influenza (flu) vaccine °· This is recommended every year. °Tetanus, diphtheria, and pertussis (Tdap) vaccine °· You may need a Td booster every 10 years. °Varicella (chickenpox) vaccine °· You may need this if you have not been vaccinated. °Zoster (shingles) vaccine °· You may need this after age 60. °Measles, mumps, and rubella (MMR)  vaccine °· You may need at least one dose of MMR if you were born in 1957 or later. You may also need a second dose. °Pneumococcal conjugate (PCV13) vaccine °· You may need this if you have certain conditions and were not previously vaccinated. °Pneumococcal polysaccharide (PPSV23) vaccine °· You may need one or two doses if you smoke cigarettes or if you have certain conditions. °Meningococcal conjugate (MenACWY) vaccine °· You may need this if you have certain conditions. °Hepatitis A vaccine °· You may need this if you have certain conditions or if you travel or work in places where you may be exposed to hepatitis A. °Hepatitis B vaccine °· You may need this if you have certain conditions or if you travel or work in places where you may be exposed to hepatitis B. °Haemophilus influenzae type b (Hib) vaccine °· You may need this if you have certain conditions. °Human papillomavirus (HPV) vaccine °· If recommended by your health care provider, you may need three doses over 6 months. °You may receive vaccines as individual doses or as more than one vaccine together in one shot (combination vaccines). Talk with your health care provider about the risks and benefits of combination vaccines. °What tests do I need? °Blood tests °· Lipid and cholesterol levels. These may be checked every 5 years, or more frequently if you are over 50 years old. °· Hepatitis C test. °· Hepatitis B test. °Screening °· Lung cancer screening. You may have this screening every year starting at age 55 if you have a 30-pack-year history of smoking and currently smoke or have quit within the past 15 years. °· Colorectal cancer screening. All adults should have this screening starting at age 50 and continuing until age 75. Your health care provider may recommend screening at age 45 if you are at increased risk. You will have tests every 1-10 years, depending on your results and the type of screening test. °· Diabetes screening. This is done by  checking your blood sugar (glucose) after you have not eaten for a while (fasting). You may have this done every 1-3 years. °· Mammogram. This may be done every 1-2 years. Talk with your health care provider about when you should start having regular mammograms. This may depend on whether you have a family history of breast cancer. °· BRCA-related cancer screening. This may be done if you have a family history of breast, ovarian, tubal, or peritoneal cancers. °· Pelvic exam and Pap test. This may be done every 3 years starting at age 21. Starting at age 30, this may be done every 5 years if you have a Pap test in combination with an HPV test. °Other tests °· Sexually transmitted disease (  STD) testing.  Bone density scan. This is done to screen for osteoporosis. You may have this scan if you are at high risk for osteoporosis. Follow these instructions at home: Eating and drinking  Eat a diet that includes fresh fruits and vegetables, whole grains, lean protein, and low-fat dairy.  Take vitamin and mineral supplements as recommended by your health care provider.  Do not drink alcohol if: ? Your health care provider tells you not to drink. ? You are pregnant, may be pregnant, or are planning to become pregnant.  If you drink alcohol: ? Limit how much you have to 0-1 drink a day. ? Be aware of how much alcohol is in your drink. In the [place] one drink equals one 12 oz bottle of beer (355 mL), one 5 oz glass of wine (148 mL), or one 1 oz glass of hard liquor (44 mL). Lifestyle  Take daily care of your teeth and gums.  Stay active. Exercise for at least 30 minutes on 5 or more days each week.  Do not use any products that contain nicotine or tobacco, such as cigarettes, e-cigarettes, and chewing tobacco. If you need help quitting, ask your health care provider.  If you are sexually active, practice safe sex. Use a condom or other form of birth control (contraception) in order to prevent pregnancy  and STIs (sexually transmitted infections).  If told by your health care provider, take low-dose aspirin daily starting at age 69. What's next?  Visit your health care provider once a year for a well check visit.  Ask your health care provider how often you should have your eyes and teeth checked.  Stay up to date on all vaccines. This information is not intended to replace advice given to you by your health care provider. Make sure you discuss any questions you have with your health care provider. Document Revised: 04/09/2018 Document Reviewed: 04/09/2018 Elsevier Patient Education  2020 [employer].  Preventing High Cholesterol Cholesterol is a white, waxy substance similar to fat that the human body needs to help build cells. The liver makes all the cholesterol that a person's body needs. Having high cholesterol (hypercholesterolemia) increases a person's risk for heart disease and stroke. Extra (excess) cholesterol comes from the food the person eats. High cholesterol can often be prevented with diet and lifestyle changes. If you already have high cholesterol, you can control it with diet and lifestyle changes and with medicine. How can high cholesterol affect me? If you have high cholesterol, deposits (plaques) may build up on the walls of your arteries. The arteries are the blood vessels that carry blood away from your heart. Plaques make the arteries narrower and stiffer. This can limit or block blood flow and cause blood clots to form. Blood clots:  Are tiny balls of cells that form in your blood.  Can move to the heart or brain, causing a heart attack or stroke. Plaques in arteries greatly increase your risk for heart attack and stroke.Making diet and lifestyle changes can reduce your risk for these conditions that may threaten your life. What can increase my risk? This condition is more likely to develop in people who:  Eat foods that are high in saturated fat or cholesterol.  Saturated fat is mostly found in: ? Foods that contain animal fat, such as red meat and some dairy products. ? Certain fatty foods made from plants, such as tropical oils.  Are overweight.  Are not getting enough exercise.  Have a family  history of high cholesterol. What actions can I take to prevent this? Nutrition   Eat less saturated fat.  Avoid trans fats (partially hydrogenated oils). These are often found in margarine and in some baked goods, fried foods, and snacks bought in packages.  Avoid precooked or cured meat, such as sausages or meat loaves.  Avoid foods and drinks that have added sugars.  Eat more fruits, vegetables, and whole grains.  Choose healthy sources of protein, such as fish, poultry, lean cuts of red meat, beans, peas, lentils, and nuts.  Choose healthy sources of fat, such as: ? Nuts. ? Vegetable oils, especially olive oil. ? Fish that have healthy fats (omega-3 fatty acids), such as mackerel or salmon. The items listed above may not be a complete list of recommended foods and beverages. Contact a dietitian for more information. Lifestyle  Lose weight if you are overweight. Losing 5-10 lb (2.3-4.5 kg) can help prevent or control high cholesterol. It can also lower your risk for diabetes and high blood pressure. Ask your health care provider to help you with a diet and exercise plan to lose weight safely.  Do not use any products that contain nicotine or tobacco, such as cigarettes, e-cigarettes, and chewing tobacco. If you need help quitting, ask your health care provider.  Limit your alcohol intake. ? Do not drink alcohol if:  Your health care provider tells you not to drink.  You are pregnant, may be pregnant, or are planning to become pregnant. ? If you drink alcohol:  Limit how much you use to:  0-1 drink a day for women.  0-2 drinks a day for men.  Be aware of how much alcohol is in your drink. In the [place] one drink equals one 12 oz  bottle of beer (355 mL), one 5 oz glass of wine (148 mL), or one 1 oz glass of hard liquor (44 mL). Activity   Get enough exercise. Each week, do at least 150 minutes of exercise that takes a medium level of effort (moderate-intensity exercise). ? This is exercise that:  Makes your heart beat faster and makes you breathe harder than usual.  Allows you to still be able to talk. ? You could exercise in short sessions several times a day or longer sessions a few times a week. For example, on 5 days each week, you could walk fast or ride your bike 3 times a day for 10 minutes each time.  Do exercises as told by your health care provider. Medicines  In addition to diet and lifestyle changes, your health care provider may recommend medicines to help lower cholesterol. This may be a medicine to lower the amount of cholesterol your liver makes. You may need medicine if: ? Diet and lifestyle changes do not lower your cholesterol enough. ? You have high cholesterol and other risk factors for heart disease or stroke.  Take over-the-counter and prescription medicines only as told by your health care provider. General information  Manage your risk factors for high cholesterol. Talk with your health care provider about all your risk factors and how to lower your risk.  Manage other conditions that you have, such as diabetes or high blood pressure (hypertension).  Have blood tests to check your cholesterol levels at regular points in time as told by your health care provider.  Keep all follow-up visits as told by your health care provider. This is important. Where to find more information  American Heart Association: www.heart.org  National Heart, Lung,  and Blood Institute: https://wilson-eaton.com/ Summary  High cholesterol increases your risk for heart disease and stroke. By keeping your cholesterol level low, you can reduce your risk for these conditions.  High cholesterol can often be prevented  with diet and lifestyle changes.  Work with your health care provider to manage your risk factors, and have your blood tested regularly. This information is not intended to replace advice given to you by your health care provider. Make sure you discuss any questions you have with your health care provider. Document Revised: 11/20/2018 Document Reviewed: 04/06/2016 Elsevier Patient Education  2020 [employer].

## 2019-11-23 NOTE — Progress Notes (Addendum)
New Patient Office Visit  Subjective:  Patient ID: Cathy Hamilton, female    DOB: 06-26-1976  Age: 44 y.o. MRN: [id number]  CC:  Chief Complaint  Patient presents with  . Establish Care    c/o muscle spasms that come and go x     HPI Cathy Hamilton presents for establishment of care, physical exam and follow-up of some medical issues that she has been experiencing.  Recent history of elevated LDL cholesterol.  There is a family history of elevated cholesterol.  She had started atorvastatin briefly but was lost to follow-up secondary to the pandemic.  Since that on follow-up her cholesterol had decreased with dietary change.  She has been experiencing fasciculations in random places about her entire body.  These seem to start after she and her family moved here from [place] about a year ago.  They seem to be slowly fading away.  She denies headaches, diplopia, random areas of paresthesia or weakness.  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  Father passed at age 49.  His passing was thought to be associated with alcoholism, hypertension and CVA.  Mom is in her 53s and has diabetes.  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  Denies gestational diabetes with any of the pregnancies.  Past Medical History:  Diagnosis Date  . Allergy   . GERD (gastroesophageal reflux disease)    with pregnancy-no meds  . Hyperlipidemia     Past Surgical History:  Procedure Laterality Date  . CESAREAN SECTION  2009, 2011  . CESAREAN SECTION  03/20/2012   Procedure: CESAREAN SECTION;  Surgeon: Darlyn Chamber, MD;  Location: [facility];  Service: Gynecology;  Laterality: N/A;    Family History  Problem Relation Age of Onset  . Hyperlipidemia Mother   . Diabetes Mother   . Arthritis Mother   . Cataracts Mother   . Hyperlipidemia Father   . Alcohol abuse Father   . Stroke Father   . Emphysema Father   . Hyperlipidemia  Sister   . Anemia Sister   . Cancer Maternal Grandfather   . Hyperlipidemia Sister   . Anemia Sister     Social History   Socioeconomic History  . Marital status: Married    Spouse name: Not on file  . Number of children: Not on file  . Years of education: Not on file  . Highest education level: Not on file  Occupational History  . Not on file  Tobacco Use  . Smoking status: Never Smoker  . Smokeless tobacco: Never Used  Substance and Sexual Activity  . Alcohol use: No  . Drug use: No  . Sexual activity: Yes    Birth control/protection: Pill  Other Topics Concern  . Not on file  Social History Narrative  . Not on file   Social Determinants of Health   Financial Resource Strain:   . Difficulty of Paying Living Expenses:   Food Insecurity:   . Worried About Charity fundraiser in the Last Year:   . Arboriculturist in the Last Year:   Transportation Needs:   . Film/video editor (Medical):   Marland Kitchen Lack of Transportation (Non-Medical):   Physical Activity:   . Days of Exercise per Week:   . Minutes of Exercise per Session:   Stress:   . Feeling of Stress :   Social Connections:   .  Frequency of Communication with Friends and Family:   . Frequency of Social Gatherings with Friends and Family:   . Attends Religious Services:   . Active Member of Clubs or Organizations:   . Attends Archivist Meetings:   Marland Kitchen Marital Status:   Intimate Partner Violence:   . Fear of Current or Ex-Partner:   . Emotionally Abused:   Marland Kitchen Physically Abused:   . Sexually Abused:     ROS Review of Systems  Constitutional: Negative.   HENT: Negative.   Eyes: Negative for photophobia and visual disturbance.  Respiratory: Negative.   Cardiovascular: Negative.   Gastrointestinal: Negative.   Endocrine: Negative for polyphagia and polyuria.  Genitourinary: Negative.   Musculoskeletal: Negative for gait problem and joint swelling.  Skin: Negative for pallor and rash.    Allergic/Immunologic: Negative for immunocompromised state.  Neurological: Negative for tremors, facial asymmetry, speech difficulty, weakness, numbness and headaches.  Hematological: Does not bruise/bleed easily.  Psychiatric/Behavioral: Negative.    Depression screen [facility] 2/9 11/23/2019 11/23/2019  Decreased Interest 0 0  Down, Depressed, Hopeless 0 0  PHQ - 2 Score 0 0  Altered sleeping 0 -  Tired, decreased energy 1 -  Change in appetite 0 -  Feeling bad or failure about yourself  0 -  Trouble concentrating 0 -  Moving slowly or fidgety/restless 0 -  Suicidal thoughts 0 -  PHQ-9 Score 1 -  Difficult doing work/chores Not difficult at all -  Some encounter information is confidential and restricted. Go to Review Flowsheets activity to see all data.     Objective:   Today's Vitals: BP 108/68   Pulse 89   Temp 98.2 F (36.8 C) (Tympanic)   Ht 5\' 7"  (1.702 m)   Wt 159 lb 9.6 oz (72.4 kg)   SpO2 98%   BMI 25.00 kg/m   Physical Exam Vitals and nursing note reviewed.  Constitutional:      General: She is not in acute distress.    Appearance: Normal appearance. She is normal weight. She is not ill-appearing, toxic-appearing or diaphoretic.  HENT:     Head: Normocephalic and atraumatic.     Right Ear: Tympanic membrane, ear canal and external ear normal. There is no impacted cerumen.     Left Ear: Tympanic membrane, ear canal and external ear normal. There is no impacted cerumen.     Nose: No congestion or rhinorrhea.     Mouth/Throat:     Mouth: Mucous membranes are moist.     Pharynx: Oropharynx is clear. No oropharyngeal exudate or posterior oropharyngeal erythema.  Eyes:     General: No scleral icterus.       Right eye: No discharge.        Left eye: No discharge.     Extraocular Movements: Extraocular movements intact.     Conjunctiva/sclera: Conjunctivae normal.     Pupils: Pupils are equal, round, and reactive to light.  Cardiovascular:     Rate and Rhythm:  Normal rate and regular rhythm.     Pulses: Normal pulses.     Heart sounds: Normal heart sounds.  Pulmonary:     Effort: Pulmonary effort is normal.     Breath sounds: Normal breath sounds.  Abdominal:     General: Bowel sounds are normal.  Musculoskeletal:     Cervical back: Normal range of motion and neck supple. No rigidity or tenderness.     Right lower leg: No edema.     Left lower leg:  No edema.  Lymphadenopathy:     Cervical: No cervical adenopathy.  Skin:    General: Skin is warm and dry.  Neurological:     General: No focal deficit present.     Mental Status: She is alert and oriented to person, place, and time.     Cranial Nerves: No cranial nerve deficit.     Sensory: No sensory deficit.     Motor: No weakness or tremor.     Coordination: Coordination normal.     Deep Tendon Reflexes: Reflexes normal.     Reflex Scores:      Tricep reflexes are 1+ on the right side and 1+ on the left side.      Bicep reflexes are 1+ on the right side and 1+ on the left side.      Brachioradialis reflexes are 1+ on the right side and 1+ on the left side.      Patellar reflexes are 1+ on the right side and 1+ on the left side.      Achilles reflexes are 1+ on the right side and 1+ on the left side. Psychiatric:        Mood and Affect: Mood normal.        Behavior: Behavior normal.     Assessment & Plan:   Problem List Items Addressed This Visit      Other   History of TMJ syndrome   Muscle fasciculation   Relevant Orders   TSH (Completed)   Healthcare maintenance - Primary   Relevant Orders   CBC (Completed)   Comprehensive metabolic panel (Completed)   LDL cholesterol, direct (Completed)   Lipid panel (Completed)   Urinalysis, Routine w reflex microscopic (Completed)   TSH (Completed)   Ear congestion   Relevant Orders   Ambulatory referral to ENT    Other Visit Diagnoses    Elevated LDL cholesterol level       Relevant Medications   atorvastatin (LIPITOR) 20 MG  tablet      Outpatient Encounter Medications as of 11/23/2019  Medication Sig  . diclofenac (VOLTAREN) 75 MG EC tablet Take by mouth.  Marland Kitchen atorvastatin (LIPITOR) 20 MG tablet Take 1 tablet (20 mg total) by mouth daily.  Marland Kitchen azithromycin (ZITHROMAX) 250 MG tablet As packaged (Patient not taking: Reported on 11/23/2019)  . calcium carbonate 100 mg/ml SUSP Take by mouth.  . cetirizine-pseudoephedrine (ZYRTEC-D) 5-120 MG tablet Take 1 tablet by mouth 2 (two) times daily. (Patient not taking: Reported on 11/23/2019)  . fluticasone (FLONASE) 50 MCG/ACT nasal spray 1 spray each nostril twice a day (Patient not taking: Reported on 11/23/2019)  . Multiple Vitamin (MULTIVITAMIN) capsule Take by mouth.  . Omega-3 1000 MG CAPS Take by mouth.  . [DISCONTINUED] atorvastatin (LIPITOR) 20 MG tablet Take 1 tablet (20 mg total) by mouth daily. (Patient not taking: Reported on 11/23/2019)   No facility-administered encounter medications on file as of 11/23/2019.    Follow-up: Return in about 6 months (around 05/24/2020), or if symptoms worsen or fail to improve.  Patient was given information on health maintenance and disease prevention.  She was also given information on preventing high cholesterol.  She will return fasting for above ordered blood work.  Discussed LDL and HDL cholesterol.  Libby Maw, MD

## 2019-11-25 ENCOUNTER — Other Ambulatory Visit (INDEPENDENT_AMBULATORY_CARE_PROVIDER_SITE_OTHER): Payer: 59

## 2019-11-25 ENCOUNTER — Other Ambulatory Visit: Payer: Self-pay

## 2019-11-25 DIAGNOSIS — R253 Fasciculation: Secondary | ICD-10-CM

## 2019-11-25 DIAGNOSIS — Z0000 Encounter for general adult medical examination without abnormal findings: Secondary | ICD-10-CM | POA: Diagnosis not present

## 2019-11-25 LAB — COMPREHENSIVE METABOLIC PANEL
ALT: 23 U/L (ref 0–35)
AST: 21 U/L (ref 0–37)
Albumin: 4.2 g/dL (ref 3.5–5.2)
Alkaline Phosphatase: 49 U/L (ref 39–117)
BUN: 9 mg/dL (ref 6–23)
CO2: 28 mEq/L (ref 19–32)
Calcium: 9.1 mg/dL (ref 8.4–10.5)
Chloride: 106 mEq/L (ref 96–112)
Creatinine, Ser: 0.83 mg/dL (ref 0.40–1.20)
GFR: 91.05 mL/min (ref >60.00)
Glucose, Bld: 97 mg/dL (ref 70–99)
Potassium: 4.9 mEq/L (ref 3.5–5.1)
Sodium: 140 mEq/L (ref 135–145)
Total Bilirubin: 0.5 mg/dL (ref 0.2–1.2)
Total Protein: 6.5 g/dL (ref 6.0–8.3)

## 2019-11-25 LAB — URINALYSIS, ROUTINE W REFLEX MICROSCOPIC
Bilirubin Urine: NEGATIVE
Hgb urine dipstick: NEGATIVE
Ketones, ur: NEGATIVE
Leukocytes,Ua: NEGATIVE
Nitrite: NEGATIVE
Specific Gravity, Urine: 1.02 (ref 1.000–1.030)
Total Protein, Urine: NEGATIVE
Urine Glucose: NEGATIVE
Urobilinogen, UA: 0.2 (ref 0.0–1.0)
pH: 6.5 (ref 5.0–8.0)

## 2019-11-25 LAB — LIPID PANEL
Cholesterol: 282 mg/dL — ABNORMAL HIGH (ref 0–200)
HDL: 57.3 mg/dL (ref >39.00)
LDL Cholesterol: 201 mg/dL — ABNORMAL HIGH (ref 0–99)
NonHDL: 224.77
Total CHOL/HDL Ratio: 5
Triglycerides: 118 mg/dL (ref 0.0–149.0)
VLDL: 23.6 mg/dL (ref 0.0–40.0)

## 2019-11-25 LAB — CBC
HCT: 42.5 % (ref 36.0–46.0)
Hemoglobin: 14 g/dL (ref 12.0–15.0)
MCHC: 32.9 g/dL (ref 30.0–36.0)
MCV: 84.4 fl (ref 78.0–100.0)
Platelets: 170 10*3/uL (ref 150.0–400.0)
RBC: 5.04 Mil/uL (ref 3.87–5.11)
RDW: 14 % (ref 11.5–15.5)
WBC: 4.8 10*3/uL (ref 4.0–10.5)

## 2019-11-25 LAB — LDL CHOLESTEROL, DIRECT: Direct LDL: 175 mg/dL

## 2019-11-25 LAB — TSH: TSH: 1.39 u[IU]/mL (ref 0.35–4.50)

## 2019-11-26 MED ORDER — ATORVASTATIN CALCIUM 20 MG PO TABS: 20.0000 mg | ORAL_TABLET | Freq: Every day | ORAL | 3 refills | Status: DC

## 2019-11-26 NOTE — Addendum Note (Signed)
Addended by: Jon Billings on: 11/26/2019 06:01 AM   Modules accepted: Orders

## 2019-11-30 ENCOUNTER — Telehealth: Payer: Self-pay | Admitting: Family Medicine

## 2019-11-30 NOTE — Telephone Encounter (Signed)
Patient is calling back and has questions about her lab results. CB is [phone]

## 2019-12-14 ENCOUNTER — Telehealth: Payer: Self-pay | Admitting: Family Medicine

## 2019-12-14 NOTE — Telephone Encounter (Signed)
Pt informed of Dr. Bebe Shaggy feedback.  Pt understands.

## 2019-12-14 NOTE — Telephone Encounter (Signed)
Needs to be seen if she develops any headache, fever or rash.

## 2019-12-14 NOTE — Telephone Encounter (Signed)
Patient found and removed a small tick a couple days ago and today noticed a small, pink bump. She would like to know if she needs to do anything about it.

## 2019-12-16 NOTE — Telephone Encounter (Signed)
Spoke with patient went over lab results with here and gave ranges

## 2019-12-27 ENCOUNTER — Telehealth: Payer: Self-pay | Admitting: Family Medicine

## 2019-12-27 DIAGNOSIS — H938X9 Other specified disorders of ear, unspecified ear: Secondary | ICD-10-CM

## 2019-12-27 HISTORY — DX: Other specified disorders of ear, unspecified ear: H93.8X9

## 2019-12-27 NOTE — Telephone Encounter (Signed)
Patient states she is still having troubles with intermittent stuffiness in her ears. She would like a referral to an ENT specialist.

## 2019-12-27 NOTE — Addendum Note (Signed)
Addended by: Jon Billings on: 12/27/2019 04:16 PM   Modules accepted: Orders

## 2020-01-05 NOTE — Telephone Encounter (Signed)
Patient aware that referral put in and she should hear from someone to schedule an appointment.

## 2020-01-18 ENCOUNTER — Other Ambulatory Visit: Payer: Self-pay

## 2020-01-18 ENCOUNTER — Encounter (INDEPENDENT_AMBULATORY_CARE_PROVIDER_SITE_OTHER): Payer: Self-pay | Admitting: Otolaryngology

## 2020-01-18 ENCOUNTER — Ambulatory Visit (INDEPENDENT_AMBULATORY_CARE_PROVIDER_SITE_OTHER): Payer: 59 | Admitting: Otolaryngology

## 2020-01-18 VITALS — Temp 97.7°F

## 2020-01-18 DIAGNOSIS — J310 Chronic rhinitis: Secondary | ICD-10-CM | POA: Diagnosis not present

## 2020-01-18 DIAGNOSIS — M26609 Unspecified temporomandibular joint disorder, unspecified side: Secondary | ICD-10-CM | POA: Diagnosis not present

## 2020-01-18 NOTE — Progress Notes (Signed)
HPI: Cathy Hamilton is a 44 y.o. female who presents is referred by Dr. Ethelene Hal For evaluation of right ear complaints where she has intermittent fullness and pain that may be related more to TMJ dysfunction as she has had a history of right TMJ problems.  She has used a splint previously because of pressure grinding her teeth at night.  She also complains of intermittent soreness in her nose where she has used Neosporin for some scabbing and crusting especially on the superior left nostril.  Her symptoms are not bad today.  She has mild intermittent nasal congestion with clear mucus discharge. She has not noted any hearing problems.  Past Medical History:  Diagnosis Date  . Allergy   . GERD (gastroesophageal reflux disease)    with pregnancy-no meds  . Hyperlipidemia    Past Surgical History:  Procedure Laterality Date  . CESAREAN SECTION  2009, 2011  . CESAREAN SECTION  03/20/2012   Procedure: CESAREAN SECTION;  Surgeon: Darlyn Chamber, MD;  Location: [facility];  Service: Gynecology;  Laterality: N/A;   Social History   Socioeconomic History  . Marital status: Married    Spouse name: Not on file  . Number of children: Not on file  . Years of education: Not on file  . Highest education level: Not on file  Occupational History  . Not on file  Tobacco Use  . Smoking status: Never Smoker  . Smokeless tobacco: Never Used  Substance and Sexual Activity  . Alcohol use: No  . Drug use: No  . Sexual activity: Yes    Birth control/protection: Pill  Other Topics Concern  . Not on file  Social History Narrative  . Not on file   Social Determinants of Health   Financial Resource Strain:   . Difficulty of Paying Living Expenses:   Food Insecurity:   . Worried About Charity fundraiser in the Last Year:   . Arboriculturist in the Last Year:   Transportation Needs:   . Film/video editor (Medical):   Marland Kitchen Lack of Transportation (Non-Medical):   Physical Activity:   . Days of  Exercise per Week:   . Minutes of Exercise per Session:   Stress:   . Feeling of Stress :   Social Connections:   . Frequency of Communication with Friends and Family:   . Frequency of Social Gatherings with Friends and Family:   . Attends Religious Services:   . Active Member of Clubs or Organizations:   . Attends Archivist Meetings:   Marland Kitchen Marital Status:    Family History  Problem Relation Age of Onset  . Hyperlipidemia Mother   . Diabetes Mother   . Arthritis Mother   . Cataracts Mother   . Hyperlipidemia Father   . Alcohol abuse Father   . Stroke Father   . Emphysema Father   . Hyperlipidemia Sister   . Anemia Sister   . Cancer Maternal Grandfather   . Hyperlipidemia Sister   . Anemia Sister    Allergies  Allergen Reactions  . Penicillins Rash and Other (See Comments)    Childhood reaction   Prior to Admission medications   Medication Sig Start Date End Date Taking? Authorizing Provider  atorvastatin (LIPITOR) 20 MG tablet Take 1 tablet (20 mg total) by mouth daily. 11/26/19  Yes Libby Maw, MD  azithromycin [facility]) 250 MG tablet As packaged 08/02/15  Yes Leandrew Koyanagi, MD  calcium carbonate 100 mg/ml  SUSP Take by mouth.   Yes [provider]  cetirizine-pseudoephedrine (ZYRTEC-D) 5-120 MG tablet Take 1 tablet by mouth 2 (two) times daily. 08/02/15  Yes Leandrew Koyanagi, MD  diclofenac (VOLTAREN) 75 MG EC tablet Take by mouth. 11/12/18  Yes [provider]  fluticasone [facility]encion [facility]) 50 MCG/ACT nasal spray 1 spray each nostril twice a day 08/02/15  Yes Leandrew Koyanagi, MD  Multiple Vitamin (MULTIVITAMIN) capsule Take by mouth.   Yes [provider]  Omega-3 1000 MG CAPS Take by mouth.   Yes [provider]     Positive ROS: Otherwise negative  All other systems have been reviewed and were otherwise negative with the exception of those mentioned in the HPI and as above.  Physical  Exam: Constitutional: Alert, well-appearing, no acute distress Ears: External ears without lesions or tenderness.  Ear canals and TMs are clear bilaterally she has no wax buildup.  Typically the right TM is clear with good mobility on pneumatic otoscopy.  On hearing screening with the 512 1024 tuning fork she hears well in both ears with symmetric hearing and AC > BC bilaterally. Nasal: External nose without lesions. Septum midline.  Mild mucosal edema.  Both millimeters regions were clear with no signs of infection..  She has no scabbing or crusting within the nasal vestibule today. Oral: Lips and gums without lesions. Tongue and palate mucosa without lesions. Posterior oropharynx clear. Neck: No palpable adenopathy or masses Respiratory: Breathing comfortably  Skin: No facial/neck lesions or rash noted.  Procedures  Assessment: Right ear fullness and discomfort I suspect is probably more related to TMJ dysfunction.  As she has a normal TM examination. Nasal vestibulitis appears clear today. Mild rhinitis.  Plan: Suggested use of Nasacort 2 sprays each nostril at night as this will help with nasal congestion and some the mucus discharge. Concerning treatment of the small sores that she gets in the nostril suggested trying mupirocin 2% ointment twice daily as needed. Reassured her of normal ear examination and I suggest the discomfort she has in the right ear is probably more TMJ related and would use what she normally treats her TMJ with.   Radene Journey, MD   CC:

## 2020-04-03 LAB — OB RESULTS CONSOLE GC/CHLAMYDIA
Chlamydia: NEGATIVE
Gonorrhea: NEGATIVE

## 2020-04-03 LAB — OB RESULTS CONSOLE HEPATITIS B SURFACE ANTIGEN: Hepatitis B Surface Ag: NEGATIVE

## 2020-04-03 LAB — OB RESULTS CONSOLE RPR: RPR: NONREACTIVE

## 2020-04-03 LAB — OB RESULTS CONSOLE RUBELLA ANTIBODY, IGM: Rubella: IMMUNE

## 2020-04-03 LAB — OB RESULTS CONSOLE ANTIBODY SCREEN: Antibody Screen: NEGATIVE

## 2020-04-03 LAB — OB RESULTS CONSOLE ABO/RH: RH Type: POSITIVE

## 2020-04-03 LAB — OB RESULTS CONSOLE HIV ANTIBODY (ROUTINE TESTING): HIV: NONREACTIVE

## 2020-05-30 ENCOUNTER — Ambulatory Visit: Payer: 59 | Admitting: Family Medicine

## 2020-08-14 DIAGNOSIS — O99810 Abnormal glucose complicating pregnancy: Secondary | ICD-10-CM | POA: Diagnosis not present

## 2020-09-27 DIAGNOSIS — Z20822 Contact with and (suspected) exposure to covid-19: Secondary | ICD-10-CM | POA: Diagnosis not present

## 2020-10-11 DIAGNOSIS — Z3685 Encounter for antenatal screening for Streptococcus B: Secondary | ICD-10-CM | POA: Diagnosis not present

## 2020-10-17 ENCOUNTER — Telehealth (HOSPITAL_COMMUNITY): Payer: Self-pay | Admitting: *Deleted

## 2020-10-17 NOTE — Telephone Encounter (Signed)
Preadmission screen  

## 2020-10-17 NOTE — Patient Instructions (Signed)
TRUDA STAUB  10/17/2020   Your procedure is scheduled on:  10/25/2020  Arrive at [place] at [employer] [facility] on [address] at [facility]  and [address]. You are invited to use the FREE valet parking or use the Visitor's parking deck.  Pick up the phone at the desk and dial [phone].  Call this number if you have problems the morning of surgery: [phone]  Remember:   Do not eat food:(After Midnight) Desps de medianoche.  Do not drink clear liquids: (After Midnight) Desps de medianoche.  Take these medicines the morning of surgery with A SIP OF WATER:  none   Do not wear jewelry, make-up or nail polish.  Do not wear lotions, powders, or perfumes. Do not wear deodorant.  Do not shave 48 hours prior to surgery.  Do not bring valuables to the hospital.  [facility] is not   responsible for any belongings or valuables brought to the hospital.  Contacts, dentures or bridgework may not be worn into surgery.  Leave suitcase in the car. After surgery it may be brought to your room.  For patients admitted to the hospital, checkout time is 11:00 AM the day of              discharge.      Please read over the following fact sheets that you were given:     Preparing for Surgery

## 2020-10-18 ENCOUNTER — Encounter (HOSPITAL_COMMUNITY): Payer: Self-pay

## 2020-10-18 ENCOUNTER — Encounter (HOSPITAL_COMMUNITY): Payer: Self-pay | Admitting: *Deleted

## 2020-10-23 ENCOUNTER — Encounter (HOSPITAL_COMMUNITY)
Admission: RE | Admit: 2020-10-23 | Discharge: 2020-10-23 | Disposition: A | Discharge status: Home / Self Care | Payer: Medicaid Other | Source: Ambulatory Visit | Attending: Obstetrics and Gynecology | Admitting: Obstetrics and Gynecology

## 2020-10-23 ENCOUNTER — Other Ambulatory Visit (HOSPITAL_COMMUNITY)
Admission: RE | Admit: 2020-10-23 | Discharge: 2020-10-23 | Disposition: A | Discharge status: Home / Self Care | Payer: Medicaid Other | Source: Ambulatory Visit | Attending: Obstetrics and Gynecology | Admitting: Obstetrics and Gynecology

## 2020-10-23 ENCOUNTER — Other Ambulatory Visit: Payer: Self-pay

## 2020-10-23 DIAGNOSIS — Z20822 Contact with and (suspected) exposure to covid-19: Secondary | ICD-10-CM | POA: Insufficient documentation

## 2020-10-23 DIAGNOSIS — Z01812 Encounter for preprocedural laboratory examination: Secondary | ICD-10-CM | POA: Insufficient documentation

## 2020-10-23 LAB — SARS CORONAVIRUS 2 (TAT 6-24 HRS): SARS Coronavirus 2: NEGATIVE

## 2020-10-23 LAB — CBC
HCT: 39.8 % (ref 36.0–46.0)
Hemoglobin: 12.9 g/dL (ref 12.0–15.0)
MCH: 28.1 pg (ref 26.0–34.0)
MCHC: 32.4 g/dL (ref 30.0–36.0)
MCV: 86.7 fL (ref 80.0–100.0)
Platelets: 162 10*3/uL (ref 150–400)
RBC: 4.59 MIL/uL (ref 3.87–5.11)
RDW: 14.9 % (ref 11.5–15.5)
WBC: 8.1 10*3/uL (ref 4.0–10.5)
nRBC: 0 % (ref 0.0–0.2)

## 2020-10-24 LAB — RPR: RPR Ser Ql: NONREACTIVE

## 2020-10-24 LAB — TYPE AND SCREEN
ABO/RH(D): B POS
Antibody Screen: NEGATIVE

## 2020-10-24 NOTE — Anesthesia Preprocedure Evaluation (Addendum)
Anesthesia Evaluation  Patient identified by MRN, date of birth, ID band Patient awake    Reviewed: Allergy & Precautions, H&P , NPO status , Patient's Chart, lab work & pertinent test results  History of Anesthesia Complications Negative for: history of anesthetic complications  Airway Mallampati: II  TM Distance: >3 FB Neck ROM: Full    Dental no notable dental hx. (+) Dental Advisory Given   Pulmonary neg pulmonary ROS,    Pulmonary exam normal breath sounds clear to auscultation- rhonchi       Cardiovascular negative cardio ROS Normal cardiovascular exam Rhythm:Regular Rate:Normal     Neuro/Psych negative neurological ROS  negative psych ROS   GI/Hepatic Neg liver ROS, GERD (with pregnancy, no meds)  ,  Endo/Other  negative endocrine ROS  Renal/GU negative Renal ROS  negative genitourinary   Musculoskeletal   Abdominal (+) + obese,   Peds  Hematology negative hematology ROS (+)   Anesthesia Other Findings   Reproductive/Obstetrics (+) Pregnancy (h/o c/s x2, for repeat c/s)                            Anesthesia Physical Anesthesia Plan  ASA: II  Anesthesia Plan: Spinal   Post-op Pain Management:    Induction: Intravenous  PONV Risk Score and Plan: 4 or greater and Ondansetron, Dexamethasone, Scopolamine patch - Pre-op and Treatment may vary due to age or medical condition  Airway Management Planned:   Additional Equipment:   Intra-op Plan:   Post-operative Plan:   Informed Consent: I have reviewed the patients History and Physical, chart, labs and discussed the procedure including the risks, benefits and alternatives for the proposed anesthesia with the patient or authorized representative who has indicated his/her understanding and acceptance.     Dental advisory given  Plan Discussed with: CRNA  Anesthesia Plan Comments:        Anesthesia Quick  Evaluation

## 2020-10-25 ENCOUNTER — Encounter (HOSPITAL_COMMUNITY): Payer: Self-pay | Admitting: Obstetrics and Gynecology

## 2020-10-25 ENCOUNTER — Inpatient Hospital Stay (HOSPITAL_COMMUNITY): Payer: Medicaid Other | Admitting: Anesthesiology

## 2020-10-25 ENCOUNTER — Encounter (HOSPITAL_COMMUNITY)
Admission: AD | Disposition: A | Discharge status: Home / Self Care | Payer: Self-pay | Source: Home / Self Care | Attending: Obstetrics and Gynecology

## 2020-10-25 ENCOUNTER — Inpatient Hospital Stay (HOSPITAL_COMMUNITY)
Admission: AD | Admit: 2020-10-25 | Discharge: 2020-10-28 | DRG: 788 | Disposition: A | Discharge status: Home / Self Care | Payer: Medicaid Other | Attending: Obstetrics and Gynecology | Admitting: Obstetrics and Gynecology

## 2020-10-25 ENCOUNTER — Other Ambulatory Visit: Payer: Self-pay

## 2020-10-25 DIAGNOSIS — Z3A39 39 weeks gestation of pregnancy: Secondary | ICD-10-CM | POA: Diagnosis not present

## 2020-10-25 DIAGNOSIS — O34211 Maternal care for low transverse scar from previous cesarean delivery: Secondary | ICD-10-CM | POA: Diagnosis not present

## 2020-10-25 DIAGNOSIS — Z98891 History of uterine scar from previous surgery: Secondary | ICD-10-CM

## 2020-10-25 DIAGNOSIS — O3663X0 Maternal care for excessive fetal growth, third trimester, not applicable or unspecified: Secondary | ICD-10-CM | POA: Diagnosis present

## 2020-10-25 HISTORY — DX: History of uterine scar from previous surgery: Z98.891

## 2020-10-25 SURGERY — Surgical Case
Anesthesia: Spinal

## 2020-10-25 MED ORDER — SIMETHICONE 80 MG PO CHEW
80.0000 mg | CHEWABLE_TABLET | ORAL | Status: DC | PRN
  Administered 2020-10-25: 80 mg via ORAL

## 2020-10-25 MED ORDER — LACTATED RINGERS IV SOLN: INTRAVENOUS | Status: DC | PRN

## 2020-10-25 MED ORDER — CLINDAMYCIN PHOSPHATE 900 MG/50ML IV SOLN
900.0000 mg | INTRAVENOUS | Status: AC
  Administered 2020-10-25: 900 mg via INTRAVENOUS

## 2020-10-25 MED ORDER — PHENYLEPHRINE 40 MCG/ML (10ML) SYRINGE FOR IV PUSH (FOR BLOOD PRESSURE SUPPORT)
PREFILLED_SYRINGE | INTRAVENOUS | Status: AC
  Filled 2020-10-25: qty 10

## 2020-10-25 MED ORDER — KETOROLAC TROMETHAMINE 30 MG/ML IJ SOLN
INTRAMUSCULAR | Status: AC
  Filled 2020-10-25: qty 1

## 2020-10-25 MED ORDER — MORPHINE SULFATE (PF) 0.5 MG/ML IJ SOLN
INTRAMUSCULAR | Status: DC | PRN
  Administered 2020-10-25: .15 mg via INTRATHECAL

## 2020-10-25 MED ORDER — PROMETHAZINE HCL 25 MG/ML IJ SOLN: 6.2500 mg | INTRAMUSCULAR | Status: DC | PRN

## 2020-10-25 MED ORDER — HYDROMORPHONE HCL 1 MG/ML IJ SOLN: 0.2500 mg | INTRAMUSCULAR | Status: DC | PRN

## 2020-10-25 MED ORDER — OXYTOCIN-SODIUM CHLORIDE 30-0.9 UT/500ML-% IV SOLN
INTRAVENOUS | Status: DC | PRN
  Administered 2020-10-25: 300 mL via INTRAVENOUS

## 2020-10-25 MED ORDER — NALBUPHINE HCL 10 MG/ML IJ SOLN: 5.0000 mg | INTRAMUSCULAR | Status: DC | PRN

## 2020-10-25 MED ORDER — NALBUPHINE HCL 10 MG/ML IJ SOLN: 5.0000 mg | Freq: Once | INTRAMUSCULAR | Status: DC | PRN

## 2020-10-25 MED ORDER — ONDANSETRON HCL 4 MG/2ML IJ SOLN
INTRAMUSCULAR | Status: AC
  Filled 2020-10-25: qty 2

## 2020-10-25 MED ORDER — DEXTROSE 5 % IV SOLN
1.0000 ug/kg/h | INTRAVENOUS | Status: DC | PRN
  Filled 2020-10-25: qty 5

## 2020-10-25 MED ORDER — DIBUCAINE (PERIANAL) 1 % EX OINT
1.0000 | TOPICAL_OINTMENT | CUTANEOUS | Status: DC | PRN
Start: 2020-10-25 — End: 2020-10-28

## 2020-10-25 MED ORDER — GENTAMICIN SULFATE 40 MG/ML IJ SOLN
5.0000 mg/kg | INTRAVENOUS | Status: AC
  Administered 2020-10-25: 370 mg via INTRAVENOUS
  Filled 2020-10-25: qty 9.25

## 2020-10-25 MED ORDER — OXYTOCIN-SODIUM CHLORIDE 30-0.9 UT/500ML-% IV SOLN
INTRAVENOUS | Status: AC
  Filled 2020-10-25: qty 500

## 2020-10-25 MED ORDER — SODIUM CHLORIDE 0.9% FLUSH: 3.0000 mL | INTRAVENOUS | Status: DC | PRN

## 2020-10-25 MED ORDER — ONDANSETRON HCL 4 MG/2ML IJ SOLN: 4.0000 mg | Freq: Once | INTRAMUSCULAR | Status: DC

## 2020-10-25 MED ORDER — DIPHENHYDRAMINE HCL 50 MG/ML IJ SOLN: 12.5000 mg | INTRAMUSCULAR | Status: DC | PRN

## 2020-10-25 MED ORDER — POVIDONE-IODINE 10 % EX SWAB
2.0000 "application " | Freq: Once | CUTANEOUS | Status: AC
  Administered 2020-10-25: 2 via TOPICAL

## 2020-10-25 MED ORDER — ONDANSETRON HCL 4 MG/2ML IJ SOLN
4.0000 mg | Freq: Three times a day (TID) | INTRAMUSCULAR | Status: DC | PRN
  Administered 2020-10-25: 4 mg via INTRAVENOUS
  Filled 2020-10-25: qty 2

## 2020-10-25 MED ORDER — MEPERIDINE HCL 25 MG/ML IJ SOLN: 6.2500 mg | INTRAMUSCULAR | Status: DC | PRN

## 2020-10-25 MED ORDER — NALOXONE HCL 0.4 MG/ML IJ SOLN: 0.4000 mg | INTRAMUSCULAR | Status: DC | PRN

## 2020-10-25 MED ORDER — ONDANSETRON HCL 4 MG/2ML IJ SOLN
INTRAMUSCULAR | Status: DC | PRN
  Administered 2020-10-25: 4 mg via INTRAVENOUS

## 2020-10-25 MED ORDER — ZOLPIDEM TARTRATE 5 MG PO TABS: 5.0000 mg | ORAL_TABLET | Freq: Every evening | ORAL | Status: DC | PRN

## 2020-10-25 MED ORDER — FENTANYL CITRATE (PF) 100 MCG/2ML IJ SOLN
INTRAMUSCULAR | Status: AC
  Filled 2020-10-25: qty 2

## 2020-10-25 MED ORDER — SIMETHICONE 80 MG PO CHEW
80.0000 mg | CHEWABLE_TABLET | Freq: Three times a day (TID) | ORAL | Status: DC
  Administered 2020-10-26 – 2020-10-28 (×7): 80 mg via ORAL
  Filled 2020-10-25 (×8): qty 1

## 2020-10-25 MED ORDER — SENNOSIDES-DOCUSATE SODIUM 8.6-50 MG PO TABS
2.0000 | ORAL_TABLET | Freq: Every day | ORAL | Status: DC
  Administered 2020-10-26 – 2020-10-28 (×3): 2 via ORAL
  Filled 2020-10-25 (×3): qty 2

## 2020-10-25 MED ORDER — KETOROLAC TROMETHAMINE 30 MG/ML IJ SOLN
30.0000 mg | Freq: Once | INTRAMUSCULAR | Status: DC | PRN
  Administered 2020-10-25: 30 mg via INTRAVENOUS

## 2020-10-25 MED ORDER — PHENYLEPHRINE HCL-NACL 20-0.9 MG/250ML-% IV SOLN
INTRAVENOUS | Status: DC | PRN
  Administered 2020-10-25: 60 ug/min via INTRAVENOUS

## 2020-10-25 MED ORDER — LACTATED RINGERS IV SOLN: INTRAVENOUS | Status: DC

## 2020-10-25 MED ORDER — DIPHENHYDRAMINE HCL 25 MG PO CAPS: 25.0000 mg | ORAL_CAPSULE | Freq: Four times a day (QID) | ORAL | Status: DC | PRN

## 2020-10-25 MED ORDER — MENTHOL 3 MG MT LOZG: 1.0000 | LOZENGE | OROMUCOSAL | Status: DC | PRN

## 2020-10-25 MED ORDER — MORPHINE SULFATE (PF) 0.5 MG/ML IJ SOLN
INTRAMUSCULAR | Status: AC
  Filled 2020-10-25: qty 10

## 2020-10-25 MED ORDER — IBUPROFEN 800 MG PO TABS
800.0000 mg | ORAL_TABLET | Freq: Three times a day (TID) | ORAL | Status: AC
  Administered 2020-10-25 – 2020-10-28 (×9): 800 mg via ORAL
  Filled 2020-10-25 (×9): qty 1

## 2020-10-25 MED ORDER — SCOPOLAMINE 1 MG/3DAYS TD PT72
1.0000 | MEDICATED_PATCH | Freq: Once | TRANSDERMAL | Status: AC
  Administered 2020-10-25: 1.5 mg via TRANSDERMAL

## 2020-10-25 MED ORDER — COCONUT OIL OIL
1.0000 | TOPICAL_OIL | Status: DC | PRN
Start: 2020-10-25 — End: 2020-10-28

## 2020-10-25 MED ORDER — TETANUS-DIPHTH-ACELL PERTUSSIS 5-2.5-18.5 LF-MCG/0.5 IM SUSY: 0.5000 mL | PREFILLED_SYRINGE | Freq: Once | INTRAMUSCULAR | Status: DC

## 2020-10-25 MED ORDER — BUPIVACAINE IN DEXTROSE 0.75-8.25 % IT SOLN
INTRATHECAL | Status: DC | PRN
  Administered 2020-10-25: 1.6 mL via INTRATHECAL

## 2020-10-25 MED ORDER — SCOPOLAMINE 1 MG/3DAYS TD PT72
MEDICATED_PATCH | TRANSDERMAL | Status: AC
  Filled 2020-10-25: qty 1

## 2020-10-25 MED ORDER — WITCH HAZEL-GLYCERIN EX PADS: 1.0000 "application " | MEDICATED_PAD | CUTANEOUS | Status: DC | PRN

## 2020-10-25 MED ORDER — DIPHENHYDRAMINE HCL 25 MG PO CAPS: 25.0000 mg | ORAL_CAPSULE | ORAL | Status: DC | PRN

## 2020-10-25 MED ORDER — CLINDAMYCIN PHOSPHATE 900 MG/50ML IV SOLN
INTRAVENOUS | Status: AC
  Filled 2020-10-25: qty 50

## 2020-10-25 MED ORDER — OXYCODONE HCL 5 MG PO TABS: 5.0000 mg | ORAL_TABLET | ORAL | Status: DC | PRN

## 2020-10-25 MED ORDER — PHENYLEPHRINE HCL (PRESSORS) 10 MG/ML IV SOLN
INTRAVENOUS | Status: DC | PRN
  Administered 2020-10-25: 80 ug via INTRAVENOUS

## 2020-10-25 MED ORDER — OXYTOCIN-SODIUM CHLORIDE 30-0.9 UT/500ML-% IV SOLN: 2.5000 [IU]/h | INTRAVENOUS | Status: AC

## 2020-10-25 MED ORDER — PRENATAL MULTIVITAMIN CH
1.0000 | ORAL_TABLET | Freq: Every day | ORAL | Status: DC
  Administered 2020-10-26 – 2020-10-27 (×2): 1 via ORAL
  Filled 2020-10-25 (×2): qty 1

## 2020-10-25 MED ORDER — FENTANYL CITRATE (PF) 100 MCG/2ML IJ SOLN
INTRAMUSCULAR | Status: DC | PRN
  Administered 2020-10-25: 15 ug via INTRATHECAL

## 2020-10-25 MED ORDER — ACETAMINOPHEN 325 MG PO TABS
650.0000 mg | ORAL_TABLET | ORAL | Status: DC | PRN
  Administered 2020-10-26 – 2020-10-27 (×2): 650 mg via ORAL
  Filled 2020-10-25 (×4): qty 2

## 2020-10-25 SURGICAL SUPPLY — 36 items
APL SKNCLS STERI-STRIP NONHPOA (GAUZE/BANDAGES/DRESSINGS) ×1
BARRIER ADHS 3X4 INTERCEED (GAUZE/BANDAGES/DRESSINGS) IMPLANT
BENZOIN TINCTURE PRP APPL 2/3 (GAUZE/BANDAGES/DRESSINGS) ×1 IMPLANT
BRR ADH 4X3 ABS CNTRL BYND (GAUZE/BANDAGES/DRESSINGS)
CHLORAPREP W/TINT 26ML (MISCELLANEOUS) ×2 IMPLANT
CLAMP CORD UMBIL (MISCELLANEOUS) IMPLANT
CLOSURE STERI STRIP 1/2 X4 (GAUZE/BANDAGES/DRESSINGS) ×1 IMPLANT
CLOTH BEACON ORANGE TIMEOUT ST (SAFETY) ×2 IMPLANT
DRAPE C SECTION CLR SCREEN (DRAPES) ×1 IMPLANT
DRSG OPSITE POSTOP 4X10 (GAUZE/BANDAGES/DRESSINGS) ×2 IMPLANT
ELECT REM PT RETURN 9FT ADLT (ELECTROSURGICAL) ×2
ELECTRODE REM PT RTRN 9FT ADLT (ELECTROSURGICAL) ×1 IMPLANT
EXTRACTOR VACUUM M CUP 4 TUBE (SUCTIONS) IMPLANT
GLOVE BIO SURGEON STRL SZ 6.5 (GLOVE) ×2 IMPLANT
GLOVE BIOGEL PI IND STRL 7.0 (GLOVE) ×1 IMPLANT
GLOVE BIOGEL PI INDICATOR 7.0 (GLOVE) ×1
GOWN STRL REUS W/TWL LRG LVL3 (GOWN DISPOSABLE) ×4 IMPLANT
KIT ABG SYR 3ML LUER SLIP (SYRINGE) IMPLANT
NDL HYPO 25X5/8 SAFETYGLIDE (NEEDLE) ×1 IMPLANT
NEEDLE HYPO 22GX1.5 SAFETY (NEEDLE) IMPLANT
NEEDLE HYPO 25X5/8 SAFETYGLIDE (NEEDLE) ×2 IMPLANT
NS IRRIG 1000ML POUR BTL (IV SOLUTION) ×2 IMPLANT
PACK C SECTION WH (CUSTOM PROCEDURE TRAY) ×2 IMPLANT
PAD OB MATERNITY 4.3X12.25 (PERSONAL CARE ITEMS) ×2 IMPLANT
PENCIL SMOKE EVAC W/HOLSTER (ELECTROSURGICAL) ×2 IMPLANT
SUT CHROMIC 0 CTX 36 (SUTURE) ×4 IMPLANT
SUT PLAIN 0 NONE (SUTURE) IMPLANT
SUT PLAIN 2 0 XLH (SUTURE) IMPLANT
SUT VIC AB 0 CT1 27 (SUTURE) ×6
SUT VIC AB 0 CT1 27XBRD ANBCTR (SUTURE) ×3 IMPLANT
SUT VIC AB 4-0 KS 27 (SUTURE) IMPLANT
SYR CONTROL 10ML LL (SYRINGE) IMPLANT
TOWEL OR 17X24 6PK STRL BLUE (TOWEL DISPOSABLE) ×2 IMPLANT
TRAY FOLEY W/BAG SLVR 14FR LF (SET/KITS/TRAYS/PACK) ×2 IMPLANT
VACUUM CUP M-STYLE MYSTIC II (SUCTIONS) ×1 IMPLANT
WATER STERILE IRR 1000ML POUR (IV SOLUTION) ×2 IMPLANT

## 2020-10-25 NOTE — Op Note (Signed)
NAME: Cathy Hamilton, DUNTON MEDICAL RECORD NO: [id number] ACCOUNT NO: [id number] DATE OF BIRTH: 1976/06/13 FACILITY: [facility] LOCATION: [facility] PHYSICIAN: Sharyn Lull L. Helane Rima, MD  Operative Report   DATE OF PROCEDURE: 10/25/2020  PREOPERATIVE DIAGNOSES:  Intrauterine pregnancy at 39 weeks and 2 days and previous C-section x2.  POSTOPERATIVE DIAGNOSES:  Intrauterine pregnancy at 39 weeks and 2 days and previous C-section x2.  PROCEDURE:  Repeat low transverse cesarean section.  SURGEON:  Alfio Loescher L. Aleks Nawrot, MD  ANESTHESIA:  Spinal.  ESTIMATED BLOOD LOSS:  Less than 947 mL  COMPLICATIONS:  None.  DRAINS:  Foley catheter.  DESCRIPTION OF PROCEDURE:  The patient was taken to the operating room where spinal was placed.  She was prepped and draped.  A Foley catheter was inserted and a low transverse incision was made, carried down to the fascia.  Fascia was scored in the  midline and extended laterally.  Rectus muscles were separated in the midline in standard fashion.  The peritoneum was entered and the peritoneal cavity was stretched.  The lower uterine segment was identified and the bladder flap was developed.  A low  transverse incision was made in the uterus.  Uterus was entered using a hemostat.  Amniotic fluid was clear.  The baby was in cephalic presentation and was delivered easily with the vacuum extractor.  Baby was a female infant with Apgars 9 at 1 minute and  9 at 5 minutes and was LGA.  The cord was clamped and cut.  The baby had been evaluated by the neonatology team in standard fashion.  The cord blood was obtained.  Placenta was manually removed, normal and noted to be intact.  Uterus was cleared of all  clots and debris.  The uterine incision was closed in one layer using 0 chromic in a running locked stitch.  Irrigation was performed.  Hemostasis was very good.  The fascia was closed after the peritoneum was closed. The peritoneum was closed in a  running stitch.  The fascia was  closed in a running stitch using 0 Vicryl suture.  The skin was closed with 3-0 Vicryl on a Keith needle.  Benzoin, Steri-Strips, and a honeycomb dressing were applied.  All sponge, lap and instrument counts were correct  x2.  The patient went to recovery room in stable condition.   PUS D: 10/25/2020 9:22:46 am T: 10/25/2020 11:18:00 am  JOB: [id number]/ [id number]

## 2020-10-25 NOTE — Transfer of Care (Signed)
Immediate Anesthesia Transfer of Care Note  Patient: Cathy Hamilton  Procedure(s) Performed: REPEAT CESAREAN SECTION PREVIOUS X 3  EDC: 10-30-20 ALLERGIES: PENICILLINS (N/A )  Patient Location: PACU  Anesthesia Type:Spinal  Level of Consciousness: awake  Airway & Oxygen Therapy: Patient Spontanous Breathing  Post-op Assessment: Report given to RN  Post vital signs: Reviewed  Last Vitals:  Vitals Value Taken Time  BP    Temp    Pulse    Resp    SpO2      Last Pain:  Vitals:   10/25/20 0652  TempSrc: Oral         Complications: No complications documented.

## 2020-10-25 NOTE — Lactation Note (Signed)
This note was copied from a baby's chart. Lactation Consultation Note  Patient Name: Cathy Hamilton AJOIN'O Date: 10/25/2020 Reason for consult: Initial assessment;Mother's request;Term;Other (Comment) (LGA/ Hypoglycemia) Age:44 hours  Infant > 10 lbs, jittery, being monitored for hypoglycemia.  Mom had infant latched in cradle shallow on the nipple in a swaddle. LC assisted Mom placing infant in football semi upright position with breast compression to increase transfer at the breasts.   Mom does not have a pump at home and requested a pump. [place] set Mom up on DEBP.   Plan 1. To feed based on cues 8-12x in 24 hr period no more than 4 hrs without an attempt. Mom to offer both breasts, STS and look for signs of milk transfer.          2. Mom to offer EBM via spoon or finger feeding.            3. I and O sheet reviewed            4. [place] brochure of inpatient and outpatient services reviewed.  All questions answered at the end of the visit. Maternal Data Has patient been taught Hand Expression?: Yes Does the patient have breastfeeding experience prior to this delivery?: Yes How long did the patient breastfeed?: 6 months stopped by choice  Feeding Mother's Current Feeding Choice: Breast Milk  LATCH Score Latch: Repeated attempts needed to sustain latch, nipple held in mouth throughout feeding, stimulation needed to elicit sucking reflex.  Audible Swallowing: Spontaneous and intermittent  Type of Nipple: Everted at rest and after stimulation  Comfort (Breast/Nipple): Soft / non-tender  Hold (Positioning): Assistance needed to correctly position infant at breast and maintain latch.  LATCH Score: 8   Lactation Tools Discussed/Used Tools: Flanges;Pump Flange Size: 27 Breast pump type: Double-Electric Breast Pump Pump Education: Setup, frequency, and cleaning;Milk Storage Reason for Pumping: Increase stimulation Pumping frequency: every 3 hrs for 15  minutes  Interventions Interventions: Breast feeding basics reviewed;Breast compression;Assisted with latch;Adjust position;DEBP;Support pillows;Skin to skin;Breast massage;Position options;Hand express;Expressed milk;Education  Discharge Pump: Manual  Consult Status Consult Status: Follow-up Date: 10/26/20 Follow-up type: In-patient    Cathy Hamilton  Cathy Hamilton 10/25/2020, 3:29 PM

## 2020-10-25 NOTE — H&P (Signed)
Cathy Hamilton is a 45 y.o. G 6 P 2023 at 40 w 2 days presents for Repeat C Section History of C Section x 2  OB History    Gravida  6   Para  3   Term  2   Preterm      AB  2   Living  3     SAB      IAB  2   Ectopic      Multiple      Live Births  3          Past Medical History:  Diagnosis Date  . Allergy   . GERD (gastroesophageal reflux disease)    with pregnancy-no meds  . Hyperlipidemia    Past Surgical History:  Procedure Laterality Date  . CESAREAN SECTION  2009, 2011  . CESAREAN SECTION  03/20/2012   Procedure: CESAREAN SECTION;  Surgeon: Darlyn Chamber, MD;  Location: [facility];  Service: Gynecology;  Laterality: N/A;   Family History: family history includes Alcohol abuse in her father; Anemia in her sister and sister; Arthritis in her mother; Cancer in her maternal grandfather; Cataracts in her mother; Diabetes in her mother; Emphysema in her father; Hyperlipidemia in her mother, sister, and sister; Stroke in her father. Social History:  reports that she has never smoked. She has never used smokeless tobacco. She reports that she does not drink alcohol and does not use drugs.     Maternal Diabetes: No Genetic Screening: Normal Maternal Ultrasounds/Referrals: Normal Fetal Ultrasounds or other Referrals:  None Maternal Substance Abuse:  No Significant Maternal Medications:  None Significant Maternal Lab Results:  None Other Comments:  None  Review of Systems  All other systems reviewed and are negative.  Maternal Medical History:  Prenatal complications: no prenatal complications     Blood pressure 105/78, pulse (!) 118, resp. rate 18, height 5\' 6"  (1.676 m), weight 95.7 kg, SpO2 99 %. Maternal Exam:  Uterine Assessment: Contraction strength is mild.  Contraction frequency is regular.   Abdomen: Surgical scars: low transverse.   Fetal presentation: vertex     Physical Exam Vitals and nursing note reviewed. Exam conducted with a  chaperone present.  Constitutional:      Appearance: Normal appearance.  Cardiovascular:     Rate and Rhythm: Normal rate and regular rhythm.  Neurological:     Mental Status: She is alert.     Prenatal labs: ABO, Rh: --/--/B POS (03/14 1041) Antibody: NEG (03/14 1041) Rubella: Immune (08/23 0000) RPR: NON REACTIVE (03/14 1038)  HBsAg: Negative (08/23 0000)  HIV: Non-reactive (08/23 0000)  GBS:     Assessment/Plan: IUP at 49 w 2 days  Previous C Section x 2   Repeat LTCS  Risks reviewed Consent signed   Cyril Mourning 10/25/2020, 8:00 AM

## 2020-10-25 NOTE — Brief Op Note (Signed)
10/25/2020  9:13 AM  PATIENT:  Cathy Hamilton  45 y.o. female  PRE-OPERATIVE DIAGNOSIS:   IUP at 31 w 2 days Previous Cesarean Section x 3   POST-OPERATIVE DIAGNOSIS:  Same  PROCEDURE:  Procedure(s): REPEAT CESAREAN SECTION PREVIOUS X 3  EDC: 10-30-20 ALLERGIES: PENICILLINS (N/A)  SURGEON:  Surgeon(s) and Role:    * Dian Queen, MD - Primary  PHYSICIAN ASSISTANT:   ASSISTANTS: none   ANESTHESIA:   spinal  EBL:  75 mL   BLOOD ADMINISTERED:none  DRAINS: Urinary Catheter (Foley)   LOCAL MEDICATIONS USED:  NONE  SPECIMEN:  No Specimen  DISPOSITION OF SPECIMEN:  N/A  COUNTS:  YES  TOURNIQUET:  * No tourniquets in log *  DICTATION: .Other Dictation: Dictation Number dictated  PLAN OF CARE: Admit to inpatient   PATIENT DISPOSITION:  PACU - hemodynamically stable.   Delay start of Pharmacological VTE agent (>24hrs) due to surgical blood loss or risk of bleeding: yes

## 2020-10-25 NOTE — Anesthesia Procedure Notes (Signed)
Spinal  Patient location during procedure: OR Start time: 10/25/2020 8:12 AM End time: 10/25/2020 8:17 AM Reason for block: surgical anesthesia Staffing Anesthesiologist: Nolon Nations, MD Preanesthetic Checklist Completed: patient identified, IV checked, site marked, risks and benefits discussed, surgical consent, monitors and equipment checked, pre-op evaluation and timeout performed Spinal Block Patient position: sitting Prep: DuraPrep and site prepped and draped Patient monitoring: heart rate, cardiac monitor, continuous pulse ox and blood pressure Approach: midline Location: L2-3 Injection technique: single-shot Needle Needle type: Sprotte  Needle gauge: 24 G Needle length: 9 cm Assessment Sensory level: T4 Events: CSF return

## 2020-10-25 NOTE — Anesthesia Postprocedure Evaluation (Signed)
Anesthesia Post Note  Patient: Cathy Hamilton  Procedure(s) Performed: REPEAT CESAREAN SECTION PREVIOUS X 3  EDC: 10-30-20 ALLERGIES: PENICILLINS (N/A )     Patient location during evaluation: PACU Anesthesia Type: Spinal Level of consciousness: sedated and patient cooperative Pain management: pain level controlled Vital Signs Assessment: post-procedure vital signs reviewed and stable Respiratory status: spontaneous breathing Cardiovascular status: stable Anesthetic complications: no   No complications documented.  Last Vitals:  Vitals:   10/25/20 1228 10/25/20 1410  BP: (!) 100/51 129/68  Pulse: 76 80  Resp:    Temp: 36.6 C 36.6 C  SpO2: 100% 100%    Last Pain:  Vitals:   10/25/20 1228  TempSrc: Axillary  PainSc:                  Nolon Nations

## 2020-10-26 ENCOUNTER — Encounter (HOSPITAL_COMMUNITY): Payer: Self-pay | Admitting: Obstetrics and Gynecology

## 2020-10-26 LAB — CBC
HCT: 37.3 % (ref 36.0–46.0)
Hemoglobin: 12.3 g/dL (ref 12.0–15.0)
MCH: 28.3 pg (ref 26.0–34.0)
MCHC: 33 g/dL (ref 30.0–36.0)
MCV: 85.9 fL (ref 80.0–100.0)
Platelets: 163 10*3/uL (ref 150–400)
RBC: 4.34 MIL/uL (ref 3.87–5.11)
RDW: 14.8 % (ref 11.5–15.5)
WBC: 10.5 10*3/uL (ref 4.0–10.5)
nRBC: 0 % (ref 0.0–0.2)

## 2020-10-26 NOTE — Progress Notes (Addendum)
Subjective: Postpartum Day 1: Cesarean Delivery Patient reports tolerating PO and no problems voiding.   Desires circ but newborn is hypoglycemic  Objective: Vital signs in last 24 hours: Temp:  [97.6 F (36.4 C)-98.1 F (36.7 C)] 98.1 F (36.7 C) (03/17 0528) Pulse Rate:  [71-95] 71 (03/17 0528) Resp:  [12-18] 18 (03/17 0528) BP: (94-129)/(51-73) 101/57 (03/17 0528) SpO2:  [97 %-100 %] 100 % (03/17 0528)  Physical Exam:  General: alert, cooperative and appears stated age 45: appropriate Uterine Fundus: firm Incision: healing well, no significant drainage, honeycomb dressing with poor seal DVT Evaluation: No evidence of DVT seen on physical exam. Negative Homan's sign. No cords or calf tenderness.  Recent Labs    10/23/20 1038 10/26/20 0428  HGB 12.9 12.3  HCT 39.8 37.3    Assessment/Plan: Status post Cesarean section. Doing well postoperatively.  Continue current care. Replace honeycomb dressing Defer circ until tomorrow  Linda Hedges 10/26/2020, 8:58 AM

## 2020-10-27 NOTE — Progress Notes (Signed)
Postpartum Progress Note  Postpartum Day 2 s/p repeat Cesarean section.  Subjective:  Patient reports no overnight events.  She reports well controlled pain, ambulating without difficulty, voiding spontaneously, tolerating PO.  She reports positive flatus, Positive BM.  Vaginal bleeding is minimal.  Objective: Blood pressure 105/66, pulse 80, temperature 98.4 F (36.9 C), temperature source Oral, resp. rate 18, height 5\' 6"  (1.676 m), weight 95.7 kg, SpO2 100 %, unknown if currently breastfeeding.  Physical Exam:  General: alert and no distress Lochia: appropriate Uterine Fundus: firm Incision: clean/dry/intact DVT Evaluation: No evidence of DVT seen on physical exam.  Recent Labs    10/26/20 0428  HGB 12.3  HCT 37.3    Assessment/Plan: . Postpartum Day 2, s/p C-section . Circ today if able, baby was hypoglycemic yesterday . Lactation following . Doing well, continue routine postpartum care. Anticipate discharge today following circ   LOS: 2 days   [name] 10/27/2020, 7:43 AM

## 2020-10-28 MED ORDER — OXYCODONE HCL 5 MG PO TABS
5.0000 mg | ORAL_TABLET | ORAL | 0 refills | Status: DC | PRN
Start: 2020-10-28 — End: 2020-12-13

## 2020-10-28 MED ORDER — IBUPROFEN 800 MG PO TABS: 800.0000 mg | ORAL_TABLET | Freq: Three times a day (TID) | ORAL | 0 refills | Status: AC

## 2020-10-28 MED ORDER — ACETAMINOPHEN 325 MG PO TABS: 650.0000 mg | ORAL_TABLET | ORAL | 0 refills | Status: DC | PRN

## 2020-10-28 NOTE — Progress Notes (Signed)
Postpartum Progress Note  Postpartum Day 3 s/p repeat Cesarean section.  Subjective:  Patient reports no overnight events.  She reports well controlled pain, ambulating without difficulty, voiding spontaneously, tolerating PO.  She reports positive flatus, Positive BM.  Vaginal bleeding is minimal.  Objective: Blood pressure (!) 109/59, pulse 80, temperature 98.2 F (36.8 C), temperature source Oral, resp. rate 18, height 5\' 6"  (1.676 m), weight 95.7 kg, SpO2 99 %, unknown if currently breastfeeding.  Physical Exam:  General: alert and no distress Lochia: appropriate Uterine Fundus: firm Incision: clean/dry/intact DVT Evaluation: No evidence of DVT seen on physical exam.  Recent Labs    10/26/20 0428  HGB 12.3  HCT 37.3    Assessment/Plan: . Postpartum Day 3, s/p C-section . Circ today if able, baby was hypoglycemic yesterday and day prior . Lactation following . Doing well, continue routine postpartum care. Anticipate discharge today following circ   LOS: 3 days   [name] 10/28/2020, 7:11 AM

## 2020-10-29 NOTE — Discharge Summary (Signed)
Obstetric Discharge Summary  Cathy Hamilton is a 45 y.o. female that presented on 10/25/2020 for repeat C section.  She was admitted to labor and delivery for her planned procedure and she delivered a viable female infant on 10/25/20.  Her postpartum course was uncomplicated and on PPD#3, she reported well controlled pain, spontaneous voiding, ambulating without difficulty, and tolerating PO.  18 circumcision was initially delayed for low blood sugar but completed prior to discharge. She was stable for discharge home on 10/1918 with plans for in-office follow up.  Hemoglobin  Date Value Ref Range Status  10/26/2020 12.3 12.0 - 15.0 g/dL Final   HCT  Date Value Ref Range Status  10/26/2020 37.3 36.0 - 46.0 % Final    Physical Exam:  General: alert and no distress Lochia: appropriate Uterine Fundus: firm Incision: dressing in place DVT Evaluation: No evidence of DVT seen on physical exam.  Discharge Diagnoses: Term Pregnancy-delivered  Discharge Information: Date: 10/29/2020 Activity: pelvic rest and as tolerate Diet: routine Medications: tylenol, motrin, oxycodone Condition: stable Instructions: refer to practice specific booklet Discharge to: home  Follow-up Information    [place] Follow up.   Why: Please follow up for 6 week postpartum visit.  Contact information: [address] [place] [address] [phone]               Newborn Data: Live born female  Birth Weight: 11 lb 1.8 oz (5040 g) APGAR: 8, 9  Newborn Delivery   Birth date/time: 10/25/2020 08:44:00 Delivery type: C-Section, Vacuum Assisted Trial of labor: No C-section categorization: Repeat      Home with mother.  [name] 10/29/2020, 11:40 AM

## 2020-10-30 ENCOUNTER — Telehealth: Payer: Self-pay

## 2020-10-30 NOTE — Telephone Encounter (Signed)
Transition Care Management Unsuccessful Follow-up Telephone Call  Date of discharge and from where:  10/28/2020 from [facility]  Attempts:  1st Attempt  Reason for unsuccessful TCM follow-up call:  Left voice message

## 2020-10-31 NOTE — Telephone Encounter (Signed)
Transition Care Management Unsuccessful Follow-up Telephone Call  Date of discharge and from where:  10/28/2020 - [facility]  Attempts:  2nd Attempt  Reason for unsuccessful TCM follow-up call:  Left voice message

## 2020-11-01 NOTE — Telephone Encounter (Signed)
Transition Care Management Follow-up Telephone Call  Date of discharge and from where: 10/28/2020 from [facility].   How have you been since you were released from the hospital? Pt stated that she is feeling well.   Any questions or concerns? No  Items Reviewed:  Did the pt receive and understand the discharge instructions provided? Yes   Medications obtained and verified? Yes   Other? No   Any new allergies since your discharge? No   Dietary orders reviewed? N/a  Do you have support at home? Yes   Functional Questionnaire: (I = Independent and D = Dependent) ADLs: I  Bathing/Dressing- I  Meal Prep- I  Eating- I  Maintaining continence- I  Transferring/Ambulation- I  Managing Meds- I  Follow up appointments reviewed:   PCP Hospital f/u appt confirmed? No   Specialist Hospital f/u appt confirmed? No    Are transportation arrangements needed? No   If their condition worsens, is the pt aware to call PCP or go to the Emergency Dept.? Yes  Was the patient provided with contact information for the PCP's office or ED? Yes  Was to pt encouraged to call back with questions or concerns? Yes

## 2020-12-04 DIAGNOSIS — I82612 Acute embolism and thrombosis of superficial veins of left upper extremity: Secondary | ICD-10-CM | POA: Diagnosis not present

## 2020-12-04 DIAGNOSIS — R2232 Localized swelling, mass and lump, left upper limb: Secondary | ICD-10-CM | POA: Diagnosis not present

## 2020-12-11 ENCOUNTER — Other Ambulatory Visit: Payer: Self-pay

## 2020-12-12 ENCOUNTER — Other Ambulatory Visit: Payer: Self-pay

## 2020-12-13 ENCOUNTER — Ambulatory Visit (INDEPENDENT_AMBULATORY_CARE_PROVIDER_SITE_OTHER): Payer: Medicaid Other | Admitting: Family Medicine

## 2020-12-13 ENCOUNTER — Encounter: Payer: Self-pay | Admitting: Family Medicine

## 2020-12-13 VITALS — BP 112/68 | HR 96 | Temp 97.0°F | Ht 66.0 in | Wt 189.4 lb

## 2020-12-13 DIAGNOSIS — S86819A Strain of other muscle(s) and tendon(s) at lower leg level, unspecified leg, initial encounter: Secondary | ICD-10-CM | POA: Insufficient documentation

## 2020-12-13 DIAGNOSIS — S86811A Strain of other muscle(s) and tendon(s) at lower leg level, right leg, initial encounter: Secondary | ICD-10-CM

## 2020-12-13 HISTORY — DX: Strain of other muscle(s) and tendon(s) at lower leg level, unspecified leg, initial encounter: S86.819A

## 2020-12-13 NOTE — Progress Notes (Signed)
Okay okay thanks   Established Patient Office Visit  Subjective:  Patient ID: Cathy Hamilton, female    DOB: [birth year]  Age: 45 y.o. MRN: [id number]  CC:  Chief Complaint  Patient presents with  . Pain    C/O pain in right foot and leg x 3 weeks.     HPI Cathy Hamilton presents for evaluation of the pain that she has been having in her right lateral calf over the last week or so.  Denies any specific injury.  Denies back pain.  She is 7 weeks postpartum.  Baby is doing well.  She is breast-feeding.  She developed a superficial thrombosis just distal to an IV in her left arm.  This was documented with a recent ultrasound she tells me.  Patient has known prior history of DVT.  There is no family history of DVT.  Patient does not smoke.  History of elevated ldl cholesterol.  Treatment is on hold secondary to recent pregnancy and breast-feeding.  Past Medical History:  Diagnosis Date  . Allergy   . GERD (gastroesophageal reflux disease)    with pregnancy-no meds  . Hyperlipidemia     Past Surgical History:  Procedure Laterality Date  . CESAREAN SECTION  2009, 2011  . CESAREAN SECTION  03/20/2012   Procedure: CESAREAN SECTION;  Surgeon: Darlyn Chamber, MD;  Location: [facility];  Service: Gynecology;  Laterality: N/A;  . CESAREAN SECTION N/A 10/25/2020   Procedure: REPEAT CESAREAN SECTION PREVIOUS X 3  EDC: 10-30-20 ALLERGIES: PENICILLINS;  Surgeon: Dian Queen, MD;  Location: [facility];  Service: Obstetrics;  Laterality: N/A;    Family History  Problem Relation Age of Onset  . Hyperlipidemia Mother   . Diabetes Mother   . Arthritis Mother   . Cataracts Mother   . Alcohol abuse Father   . Stroke Father   . Emphysema Father   . Hyperlipidemia Sister   . Anemia Sister   . Cancer Maternal Grandfather   . Hyperlipidemia Sister   . Anemia Sister     Social History   Socioeconomic History  . Marital status: Married    Spouse name: Not on file  . Number of children:  Not on file  . Years of education: Not on file  . Highest education level: Not on file  Occupational History  . Not on file  Tobacco Use  . Smoking status: Never Smoker  . Smokeless tobacco: Never Used  Vaping Use  . Vaping Use: Never used  Substance and Sexual Activity  . Alcohol use: No  . Drug use: No  . Sexual activity: Yes    Birth control/protection: Pill  Other Topics Concern  . Not on file  Social History Narrative  . Not on file   Social Determinants of Health   Financial Resource Strain: Not on file  Food Insecurity: Not on file  Transportation Needs: Not on file  Physical Activity: Not on file  Stress: Not on file  Social Connections: Not on file  Intimate Partner Violence: Not on file    Outpatient Medications Prior to Visit  Medication Sig Dispense Refill  . acetaminophen (TYLENOL) 325 MG tablet Take 2 tablets (650 mg total) by mouth every 4 (four) hours as needed for mild pain (temperature > 101.5.). 30 tablet 0  . ibuprofen (ADVIL) 800 MG tablet Take 1 tablet (800 mg total) by mouth every 8 (eight) hours. 30 tablet 0  . Prenatal Vit-Fe Fumarate-FA (PRENATAL MULTIVITAMIN) TABS tablet Take  1 tablet by mouth daily.    . norethindrone (ERRIN) 0.35 MG tablet Errin 0.35 mg tablet    . oxyCODONE (OXY IR/ROXICODONE) 5 MG immediate release tablet Take 1 tablet (5 mg total) by mouth every 4 (four) hours as needed for moderate pain. (Patient not taking: Reported on 12/13/2020) 18 tablet 0  . pyridoxine (B-6) 100 MG tablet Take 50 mg by mouth daily as needed (nausea). (Patient not taking: Reported on 12/13/2020)     No facility-administered medications prior to visit.    Allergies  Allergen Reactions  . Penicillins Rash and Other (See Comments)    Childhood reaction    ROS Review of Systems  Constitutional: Negative.   HENT: Negative.   Eyes: Negative for photophobia and visual disturbance.  Respiratory: Negative.   Cardiovascular: Negative.   Gastrointestinal:  Negative.   Genitourinary: Negative.   Musculoskeletal: Positive for myalgias. Negative for back pain, gait problem and joint swelling.  Neurological: Negative for weakness and numbness.  Psychiatric/Behavioral: Negative.       Objective:    Physical Exam Constitutional:      General: She is not in acute distress.    Appearance: Normal appearance. She is not ill-appearing, toxic-appearing or diaphoretic.  HENT:     Head: Normocephalic and atraumatic.  Eyes:     Conjunctiva/sclera: Conjunctivae normal.  Cardiovascular:     Pulses:          Dorsalis pedis pulses are 2+ on the right side and 2+ on the left side.       Posterior tibial pulses are 2+ on the right side and 2+ on the left side.  Pulmonary:     Effort: Pulmonary effort is normal.  Musculoskeletal:     Left forearm: No swelling or tenderness.       Arms:     Lumbar back: No deformity or spasms. Normal range of motion.     Right lower leg: No swelling or tenderness. No edema.     Left lower leg: No swelling or tenderness. No edema.       Legs:  Skin:    General: Skin is warm and dry.  Neurological:     Mental Status: She is alert.     BP 112/68   Pulse 96   Temp (!) 97 F (36.1 C) (Temporal)   Ht 5\' 6"  (1.676 m)   Wt 189 lb 6.4 oz (85.9 kg)   SpO2 98%   BMI 30.57 kg/m  Wt Readings from Last 3 Encounters:  12/13/20 189 lb 6.4 oz (85.9 kg)  10/25/20 211 lb (95.7 kg)  10/18/20 211 lb (95.7 kg)     Health Maintenance Due  Topic Date Due  . Hepatitis C Screening  Never done    There are no preventive care reminders to display for this patient.  Lab Results  Component Value Date   TSH 1.39 11/25/2019   Lab Results  Component Value Date   WBC 10.5 10/26/2020   HGB 12.3 10/26/2020   HCT 37.3 10/26/2020   MCV 85.9 10/26/2020   PLT 163 10/26/2020   Lab Results  Component Value Date   NA 140 11/25/2019   K 4.9 11/25/2019   CO2 28 11/25/2019   GLUCOSE 97 11/25/2019   BUN 9 11/25/2019    CREATININE 0.83 11/25/2019   BILITOT 0.5 11/25/2019   ALKPHOS 49 11/25/2019   AST 21 11/25/2019   ALT 23 11/25/2019   PROT 6.5 11/25/2019   ALBUMIN 4.2 11/25/2019  CALCIUM 9.1 11/25/2019   GFR 91.05 11/25/2019   Lab Results  Component Value Date   CHOL 282 (H) 11/25/2019   Lab Results  Component Value Date   HDL 57.30 11/25/2019   Lab Results  Component Value Date   LDLCALC 201 (H) 11/25/2019   Lab Results  Component Value Date   TRIG 118.0 11/25/2019   Lab Results  Component Value Date   CHOLHDL 5 11/25/2019   No results found for: HGBA1C    Assessment & Plan:   Problem List Items Addressed This Visit      Musculoskeletal and Integument   Strain of calf muscle - Primary      No orders of the defined types were placed in this encounter.   Follow-up: Return She has a routine physical scheduled with me in a few weeks..  Reassured patient that her exam today was not consistent with a DVT.  Also reassured her that superficial clot in her left forearm will resolve and would be unlikely to travel to her heart and out into her lungs.  Libby Maw, MD

## 2020-12-19 ENCOUNTER — Other Ambulatory Visit: Payer: Self-pay

## 2020-12-20 ENCOUNTER — Ambulatory Visit (INDEPENDENT_AMBULATORY_CARE_PROVIDER_SITE_OTHER): Payer: Medicaid Other | Admitting: Family Medicine

## 2020-12-20 ENCOUNTER — Encounter: Payer: Self-pay | Admitting: Family Medicine

## 2020-12-20 VITALS — BP 124/72 | HR 86 | Temp 97.0°F | Ht 67.0 in | Wt 190.4 lb

## 2020-12-20 DIAGNOSIS — S29011A Strain of muscle and tendon of front wall of thorax, initial encounter: Secondary | ICD-10-CM | POA: Diagnosis not present

## 2020-12-20 DIAGNOSIS — O905 Postpartum thyroiditis: Secondary | ICD-10-CM | POA: Insufficient documentation

## 2020-12-20 DIAGNOSIS — S29019A Strain of muscle and tendon of unspecified wall of thorax, initial encounter: Secondary | ICD-10-CM | POA: Insufficient documentation

## 2020-12-20 DIAGNOSIS — Z0000 Encounter for general adult medical examination without abnormal findings: Secondary | ICD-10-CM | POA: Diagnosis not present

## 2020-12-20 HISTORY — DX: Strain of muscle and tendon of unspecified wall of thorax, initial encounter: S29.019A

## 2020-12-20 LAB — URINALYSIS, ROUTINE W REFLEX MICROSCOPIC
Bilirubin Urine: NEGATIVE
Ketones, ur: NEGATIVE
Nitrite: NEGATIVE
Specific Gravity, Urine: 1.02 (ref 1.000–1.030)
Total Protein, Urine: NEGATIVE
Urine Glucose: NEGATIVE
Urobilinogen, UA: 0.2 (ref 0.0–1.0)
pH: 6 (ref 5.0–8.0)

## 2020-12-20 LAB — COMPREHENSIVE METABOLIC PANEL
ALT: 32 U/L (ref 0–35)
AST: 22 U/L (ref 0–37)
Albumin: 4.3 g/dL (ref 3.5–5.2)
Alkaline Phosphatase: 102 U/L (ref 39–117)
BUN: 16 mg/dL (ref 6–23)
CO2: 30 mEq/L (ref 19–32)
Calcium: 9.4 mg/dL (ref 8.4–10.5)
Chloride: 104 mEq/L (ref 96–112)
Creatinine, Ser: 0.99 mg/dL (ref 0.40–1.20)
GFR: 69.87 mL/min (ref >60.00)
Glucose, Bld: 88 mg/dL (ref 70–99)
Potassium: 4 mEq/L (ref 3.5–5.1)
Sodium: 139 mEq/L (ref 135–145)
Total Bilirubin: 0.5 mg/dL (ref 0.2–1.2)
Total Protein: 7 g/dL (ref 6.0–8.3)

## 2020-12-20 LAB — TSH: TSH: 1.27 u[IU]/mL (ref 0.35–4.50)

## 2020-12-20 LAB — LIPID PANEL
Cholesterol: 345 mg/dL — ABNORMAL HIGH (ref 0–200)
HDL: 58.6 mg/dL (ref >39.00)
LDL Cholesterol: 263 mg/dL — ABNORMAL HIGH (ref 0–99)
NonHDL: 286.29
Total CHOL/HDL Ratio: 6
Triglycerides: 115 mg/dL (ref 0.0–149.0)
VLDL: 23 mg/dL (ref 0.0–40.0)

## 2020-12-20 LAB — CBC
HCT: 44.5 % (ref 36.0–46.0)
Hemoglobin: 14.8 g/dL (ref 12.0–15.0)
MCHC: 33.2 g/dL (ref 30.0–36.0)
MCV: 83.8 fl (ref 78.0–100.0)
Platelets: 191 10*3/uL (ref 150.0–400.0)
RBC: 5.31 Mil/uL — ABNORMAL HIGH (ref 3.87–5.11)
RDW: 13.9 % (ref 11.5–15.5)
WBC: 4.7 10*3/uL (ref 4.0–10.5)

## 2020-12-20 LAB — HEMOGLOBIN A1C: Hgb A1c MFr Bld: 6.5 % (ref 4.6–6.5)

## 2020-12-20 NOTE — Progress Notes (Addendum)
Established Patient Office Visit  Subjective:  Patient ID: Cathy Hamilton, female    DOB: 1975-11-20  Age: 45 y.o. MRN: [id number]  CC:  Chief Complaint  Patient presents with  . Annual Exam    CPE, no concerns.     HPI Cathy Hamilton presents for physical exam status post recent C-section for delivery of her fourth child.  Baby is doing well and so his mom.  Since delivered she has noticed a popping in her right posterior chest wall.  Not particularly painful she is just noticed it.  No family history of thyroid disease.  No gestational diabetes with any of her pregnancies but her mom was a diabetic.  Past Medical History:  Diagnosis Date  . Allergy   . GERD (gastroesophageal reflux disease)    with pregnancy-no meds  . Hyperlipidemia     Past Surgical History:  Procedure Laterality Date  . CESAREAN SECTION  2009, 2011  . CESAREAN SECTION  03/20/2012   Procedure: CESAREAN SECTION;  Surgeon: Darlyn Chamber, MD;  Location: [facility];  Service: Gynecology;  Laterality: N/A;  . CESAREAN SECTION N/A 10/25/2020   Procedure: REPEAT CESAREAN SECTION PREVIOUS X 3  EDC: 10-30-20 ALLERGIES: PENICILLINS;  Surgeon: Dian Queen, MD;  Location: [facility];  Service: Obstetrics;  Laterality: N/A;    Family History  Problem Relation Age of Onset  . Hyperlipidemia Mother   . Diabetes Mother   . Arthritis Mother   . Cataracts Mother   . Alcohol abuse Father   . Stroke Father   . Emphysema Father   . Hyperlipidemia Sister   . Anemia Sister   . Cancer Maternal Grandfather   . Hyperlipidemia Sister   . Anemia Sister     Social History   Socioeconomic History  . Marital status: Married    Spouse name: Not on file  . Number of children: Not on file  . Years of education: Not on file  . Highest education level: Not on file  Occupational History  . Not on file  Tobacco Use  . Smoking status: Never Smoker  . Smokeless tobacco: Never Used  Vaping Use  . Vaping Use: Never  used  Substance and Sexual Activity  . Alcohol use: No  . Drug use: No  . Sexual activity: Yes    Birth control/protection: Pill  Other Topics Concern  . Not on file  Social History Narrative  . Not on file   Social Determinants of Health   Financial Resource Strain: Not on file  Food Insecurity: Not on file  Transportation Needs: Not on file  Physical Activity: Not on file  Stress: Not on file  Social Connections: Not on file  Intimate Partner Violence: Not on file    Outpatient Medications Prior to Visit  Medication Sig Dispense Refill  . acetaminophen (TYLENOL) 325 MG tablet Take 2 tablets (650 mg total) by mouth every 4 (four) hours as needed for mild pain (temperature > 101.5.). 30 tablet 0  . ibuprofen (ADVIL) 800 MG tablet Take 1 tablet (800 mg total) by mouth every 8 (eight) hours. 30 tablet 0  . norethindrone (MICRONOR) 0.35 MG tablet Errin 0.35 mg tablet    . Prenatal Vit-Fe Fumarate-FA (PRENATAL MULTIVITAMIN) TABS tablet Take 1 tablet by mouth daily.     No facility-administered medications prior to visit.    Allergies  Allergen Reactions  . Penicillins Rash and Other (See Comments)    Childhood reaction    ROS  Review of Systems  Constitutional: Negative.   HENT: Negative.   Eyes: Negative for photophobia and visual disturbance.  Respiratory: Negative.   Cardiovascular: Negative for chest pain.  Gastrointestinal: Negative.   Endocrine: Negative for cold intolerance, heat intolerance, polyphagia and polyuria.  Genitourinary: Negative.   Musculoskeletal: Negative for gait problem.  Allergic/Immunologic: Negative for immunocompromised state.  Neurological: Negative.   Hematological: Does not bruise/bleed easily.  Psychiatric/Behavioral: Negative.      Depression screen [facility] 2/9 12/13/2020 11/23/2019 11/23/2019  Decreased Interest 0 0 0  Down, Depressed, Hopeless 0 0 0  PHQ - 2 Score 0 0 0  Altered sleeping - 0 -  Tired, decreased energy - 1 -  Change in  appetite - 0 -  Feeling bad or failure about yourself  - 0 -  Trouble concentrating - 0 -  Moving slowly or fidgety/restless - 0 -  Suicidal thoughts - 0 -  PHQ-9 Score - 1 -  Difficult doing work/chores - Not difficult at all -  Some encounter information is confidential and restricted. Go to Review Flowsheets activity to see all data.      Objective:    Physical Exam Vitals and nursing note reviewed.  Constitutional:      General: She is not in acute distress.    Appearance: Normal appearance. She is not ill-appearing, toxic-appearing or diaphoretic.  HENT:     Head: Normocephalic and atraumatic.     Right Ear: Tympanic membrane, ear canal and external ear normal.     Left Ear: Tympanic membrane, ear canal and external ear normal.     Mouth/Throat:     Mouth: Mucous membranes are moist.     Pharynx: Oropharynx is clear. No oropharyngeal exudate or posterior oropharyngeal erythema.  Eyes:     General: No scleral icterus.       Right eye: No discharge.        Left eye: No discharge.     Extraocular Movements: Extraocular movements intact.     Conjunctiva/sclera: Conjunctivae normal.     Pupils: Pupils are equal, round, and reactive to light.  Neck:     Vascular: No carotid bruit.  Cardiovascular:     Rate and Rhythm: Normal rate and regular rhythm.  Pulmonary:     Effort: Pulmonary effort is normal.     Breath sounds: Normal breath sounds.  Abdominal:     General: Bowel sounds are normal.  Musculoskeletal:     Cervical back: No rigidity or tenderness.  Lymphadenopathy:     Cervical: No cervical adenopathy.  Skin:    General: Skin is warm and dry.  Neurological:     Mental Status: She is alert and oriented to person, place, and time.  Psychiatric:        Mood and Affect: Mood normal.        Behavior: Behavior normal.     BP 124/72   Pulse 86   Temp (!) 97 F (36.1 C) (Temporal)   Ht 5\' 7"  (1.702 m)   Wt 190 lb 6.4 oz (86.4 kg)   SpO2 98%   BMI 29.82 kg/m   Wt Readings from Last 3 Encounters:  12/20/20 190 lb 6.4 oz (86.4 kg)  12/13/20 189 lb 6.4 oz (85.9 kg)  10/25/20 211 lb (95.7 kg)     Health Maintenance Due  Topic Date Due  . Hepatitis C Screening  Never done    There are no preventive care reminders to display for this patient.  Lab  Results  Component Value Date   TSH 1.39 11/25/2019   Lab Results  Component Value Date   WBC 10.5 10/26/2020   HGB 12.3 10/26/2020   HCT 37.3 10/26/2020   MCV 85.9 10/26/2020   PLT 163 10/26/2020   Lab Results  Component Value Date   NA 140 11/25/2019   K 4.9 11/25/2019   CO2 28 11/25/2019   GLUCOSE 97 11/25/2019   BUN 9 11/25/2019   CREATININE 0.83 11/25/2019   BILITOT 0.5 11/25/2019   ALKPHOS 49 11/25/2019   AST 21 11/25/2019   ALT 23 11/25/2019   PROT 6.5 11/25/2019   ALBUMIN 4.2 11/25/2019   CALCIUM 9.1 11/25/2019   GFR 91.05 11/25/2019   Lab Results  Component Value Date   CHOL 282 (H) 11/25/2019   Lab Results  Component Value Date   HDL 57.30 11/25/2019   Lab Results  Component Value Date   LDLCALC 201 (H) 11/25/2019   Lab Results  Component Value Date   TRIG 118.0 11/25/2019   Lab Results  Component Value Date   CHOLHDL 5 11/25/2019   No results found for: HGBA1C    Assessment & Plan:   Problem List Items Addressed This Visit      Endocrine   Post partum thyroiditis   Relevant Orders   TSH     Musculoskeletal and Integument   Chest wall muscle strain     Other   Healthcare maintenance - Primary   Relevant Orders   CBC   Comprehensive metabolic panel   Lipid panel   Urinalysis, Routine w reflex microscopic   Hemoglobin A1c      No orders of the defined types were placed in this encounter.   Follow-up: No follow-ups on file.  Given information on health maintenance and disease prevention.  Encouraged her to exercise as she was able.  Further recommendations will be made pending results of lab work.  Libby Maw, MD

## 2020-12-20 NOTE — Patient Instructions (Signed)
Health Maintenance, Female Adopting a healthy lifestyle and getting preventive care are important in promoting health and wellness. Ask your health care provider about:  The right schedule for you to have regular tests and exams.  Things you can do on your own to prevent diseases and keep yourself healthy. What should I know about diet, weight, and exercise? Eat a healthy diet  Eat a diet that includes plenty of vegetables, fruits, low-fat dairy products, and lean protein.  Do not eat a lot of foods that are high in solid fats, added sugars, or sodium.   Maintain a healthy weight Body mass index (BMI) is used to identify weight problems. It estimates body fat based on height and weight. Your health care provider can help determine your BMI and help you achieve or maintain a healthy weight. Get regular exercise Get regular exercise. This is one of the most important things you can do for your health. Most adults should:  Exercise for at least 150 minutes each week. The exercise should increase your heart rate and make you sweat (moderate-intensity exercise).  Do strengthening exercises at least twice a week. This is in addition to the moderate-intensity exercise.  Spend less time sitting. Even light physical activity can be beneficial. Watch cholesterol and blood lipids Have your blood tested for lipids and cholesterol at 44 years of age, then have this test every 5 years. Have your cholesterol levels checked more often if:  Your lipid or cholesterol levels are high.  You are older than 44 years of age.  You are at high risk for heart disease. What should I know about cancer screening? Depending on your health history and family history, you may need to have cancer screening at various ages. This may include screening for:  Breast cancer.  Cervical cancer.  Colorectal cancer.  Skin cancer.  Lung cancer. What should I know about heart disease, diabetes, and high blood  pressure? Blood pressure and heart disease  High blood pressure causes heart disease and increases the risk of stroke. This is more likely to develop in people who have high blood pressure readings, are of African descent, or are overweight.  Have your blood pressure checked: ? Every 3-5 years if you are 59-59 years of age. ? Every year if you are 63 years old or older. Diabetes Have regular diabetes screenings. This checks your fasting blood sugar level. Have the screening done:  Once every three years after age 58 if you are at a normal weight and have a low risk for diabetes.  More often and at a younger age if you are overweight or have a high risk for diabetes. What should I know about preventing infection? Hepatitis B If you have a higher risk for hepatitis B, you should be screened for this virus. Talk with your health care provider to find out if you are at risk for hepatitis B infection. Hepatitis C Testing is recommended for:  Everyone born from [birth year] through 1964.  Anyone with known risk factors for hepatitis C. Sexually transmitted infections (STIs)  Get screened for STIs, including gonorrhea and chlamydia, if: ? You are sexually active and are younger than 44 years of age. ? You are older than 44 years of age and your health care provider tells you that you are at risk for this type of infection. ? Your sexual activity has changed since you were last screened, and you are at increased risk for chlamydia or gonorrhea. Ask your health care provider  if you are at risk.  Ask your health care provider about whether you are at high risk for HIV. Your health care provider may recommend a prescription medicine to help prevent HIV infection. If you choose to take medicine to prevent HIV, you should first get tested for HIV. You should then be tested every 3 months for as long as you are taking the medicine. Pregnancy  If you are about to stop having your period (premenopausal) and  you may become pregnant, seek counseling before you get pregnant.  Take 400 to 800 micrograms (mcg) of folic acid every day if you become pregnant.  Ask for birth control (contraception) if you want to prevent pregnancy. Osteoporosis and menopause Osteoporosis is a disease in which the bones lose minerals and strength with aging. This can result in bone fractures. If you are 65 years old or older, or if you are at risk for osteoporosis and fractures, ask your health care provider if you should:  Be screened for bone loss.  Take a calcium or vitamin D supplement to lower your risk of fractures.  Be given hormone replacement therapy (HRT) to treat symptoms of menopause. Follow these instructions at home: Lifestyle  Do not use any products that contain nicotine or tobacco, such as cigarettes, e-cigarettes, and chewing tobacco. If you need help quitting, ask your health care provider.  Do not use street drugs.  Do not share needles.  Ask your health care provider for help if you need support or information about quitting drugs. Alcohol use  Do not drink alcohol if: ? Your health care provider tells you not to drink. ? You are pregnant, may be pregnant, or are planning to become pregnant.  If you drink alcohol: ? Limit how much you use to 0-1 drink a day. ? Limit intake if you are breastfeeding.  Be aware of how much alcohol is in your drink. In the [place] one drink equals one 12 oz bottle of beer (355 mL), one 5 oz glass of wine (148 mL), or one 1 oz glass of hard liquor (44 mL). General instructions  Schedule regular health, dental, and eye exams.  Stay current with your vaccines.  Tell your health care provider if: ? You often feel depressed. ? You have ever been abused or do not feel safe at home. Summary  Adopting a healthy lifestyle and getting preventive care are important in promoting health and wellness.  Follow your health care provider's instructions about healthy  diet, exercising, and getting tested or screened for diseases.  Follow your health care provider's instructions on monitoring your cholesterol and blood pressure. This information is not intended to replace advice given to you by your health care provider. Make sure you discuss any questions you have with your health care provider. Document Revised: 07/22/2018 Document Reviewed: 07/22/2018 Elsevier Patient Education  2021 [employer] Inc[employer]  Preventive Care 40-64 Years Old, Female Preventive care refers to lifestyle choices and visits with your health care provider that can promote health and wellness. This includes:  A yearly physical exam. This is also called an annual wellness visit.  Regular dental and eye exams.  Immunizations.  Screening for certain conditions.  Healthy lifestyle choices, such as: ? Eating a healthy diet. ? Getting regular exercise. ? Not using drugs or products that contain nicotine and tobacco. ? Limiting alcohol use. What can I expect for my preventive care visit? Physical exam Your health care provider will check your:  Height and weight. These may   be used to calculate your BMI (body mass index). BMI is a measurement that tells if you are at a healthy weight.  Heart rate and blood pressure.  Body temperature.  Skin for abnormal spots. Counseling Your health care provider may ask you questions about your:  Past medical problems.  Family's medical history.  Alcohol, tobacco, and drug use.  Emotional well-being.  Home life and relationship well-being.  Sexual activity.  Diet, exercise, and sleep habits.  Work and work Statistician.  Access to firearms.  Method of birth control.  Menstrual cycle.  Pregnancy history. What immunizations do I need? Vaccines are usually given at various ages, according to a schedule. Your health care provider will recommend vaccines for you based on your age, medical history, and lifestyle or other factors,  such as travel or where you work.   What tests do I need? Blood tests  Lipid and cholesterol levels. These may be checked every 5 years, or more often if you are over 65 years old.  Hepatitis C test.  Hepatitis B test. Screening  Lung cancer screening. You may have this screening every year starting at age 90 if you have a 30-pack-year history of smoking and currently smoke or have quit within the past 15 years.  Colorectal cancer screening. ? All adults should have this screening starting at age 16 and continuing until age 38. ? Your health care provider may recommend screening at age 30 if you are at increased risk. ? You will have tests every 1-10 years, depending on your results and the type of screening test.  Diabetes screening. ? This is done by checking your blood sugar (glucose) after you have not eaten for a while (fasting). ? You may have this done every 1-3 years.  Mammogram. ? This may be done every 1-2 years. ? Talk with your health care provider about when you should start having regular mammograms. This may depend on whether you have a family history of breast cancer.  BRCA-related cancer screening. This may be done if you have a family history of breast, ovarian, tubal, or peritoneal cancers.  Pelvic exam and Pap test. ? This may be done every 3 years starting at age 58. ? Starting at age 60, this may be done every 5 years if you have a Pap test in combination with an HPV test. Other tests  STD (sexually transmitted disease) testing, if you are at risk.  Bone density scan. This is done to screen for osteoporosis. You may have this scan if you are at high risk for osteoporosis. Talk with your health care provider about your test results, treatment options, and if necessary, the need for more tests. Follow these instructions at home: Eating and drinking  Eat a diet that includes fresh fruits and vegetables, whole grains, lean protein, and low-fat dairy  products.  Take vitamin and mineral supplements as recommended by your health care provider.  Do not drink alcohol if: ? Your health care provider tells you not to drink. ? You are pregnant, may be pregnant, or are planning to become pregnant.  If you drink alcohol: ? Limit how much you have to 0-1 drink a day. ? Be aware of how much alcohol is in your drink. In the [place] one drink equals one 12 oz bottle of beer (355 mL), one 5 oz glass of wine (148 mL), or one 1 oz glass of hard liquor (44 mL).   Lifestyle  Take daily care of your teeth and  gums. Brush your teeth every morning and night with fluoride toothpaste. Floss one time each day.  Stay active. Exercise for at least 30 minutes 5 or more days each week.  Do not use any products that contain nicotine or tobacco, such as cigarettes, e-cigarettes, and chewing tobacco. If you need help quitting, ask your health care provider.  Do not use drugs.  If you are sexually active, practice safe sex. Use a condom or other form of protection to prevent STIs (sexually transmitted infections).  If you do not wish to become pregnant, use a form of birth control. If you plan to become pregnant, see your health care provider for a prepregnancy visit.  If told by your health care provider, take low-dose aspirin daily starting at age 61.  Find healthy ways to cope with stress, such as: ? Meditation, yoga, or listening to music. ? Journaling. ? Talking to a trusted person. ? Spending time with friends and family. Safety  Always wear your seat belt while driving or riding in a vehicle.  Do not drive: ? If you have been drinking alcohol. Do not ride with someone who has been drinking. ? When you are tired or distracted. ? While texting.  Wear a helmet and other protective equipment during sports activities.  If you have firearms in your house, make sure you follow all gun safety procedures. What's next?  Visit your health care provider  once a year for an annual wellness visit.  Ask your health care provider how often you should have your eyes and teeth checked.  Stay up to date on all vaccines. This information is not intended to replace advice given to you by your health care provider. Make sure you discuss any questions you have with your health care provider. Document Revised: 05/02/2020 Document Reviewed: 04/09/2018 Elsevier Patient Education  2021 [employer].

## 2020-12-28 ENCOUNTER — Encounter: Payer: Self-pay | Admitting: Family Medicine

## 2020-12-28 ENCOUNTER — Telehealth (INDEPENDENT_AMBULATORY_CARE_PROVIDER_SITE_OTHER): Payer: Medicaid Other | Admitting: Family Medicine

## 2020-12-28 VITALS — Ht 67.0 in

## 2020-12-28 DIAGNOSIS — E7800 Pure hypercholesterolemia, unspecified: Secondary | ICD-10-CM | POA: Diagnosis not present

## 2020-12-28 NOTE — Patient Instructions (Addendum)
Health Maintenance Due  Topic Date Due  . Hepatitis C Screening  Never done  . COVID-19 Vaccine (3 - Booster for Janssen series) 08/26/2020    Depression screen [facility] 2/9 12/20/2020 12/13/2020 11/23/2019  Decreased Interest 0 0 0  Down, Depressed, Hopeless 0 0 0  PHQ - 2 Score 0 0 0  Altered sleeping 0 - 0  Tired, decreased energy 1 - 1  Change in appetite 0 - 0  Feeling bad or failure about yourself  0 - 0  Trouble concentrating 0 - 0  Moving slowly or fidgety/restless 0 - 0  Suicidal thoughts 0 - 0  PHQ-9 Score 1 - 1  Difficult doing work/chores Not difficult at all - Not difficult at all  Some encounter information is confidential and restricted. Go to Review Flowsheets activity to see all data.    Heart-Healthy Eating Plan Heart-healthy meal planning includes:  Eating less unhealthy fats.  Eating more healthy fats.  Making other changes in your diet. Talk with your doctor or a diet specialist (dietitian) to create an eating plan that is right for you. What is my plan? Your doctor may recommend an eating plan that includes:  Total fat: ______% or less of total calories a day.  Saturated fat: ______% or less of total calories a day.  Cholesterol: less than _________mg a day. What are tips for following this plan? Cooking Avoid frying your food. Try to bake, boil, grill, or broil it instead. You can also reduce fat by:  Removing the skin from poultry.  Removing all visible fats from meats.  Steaming vegetables in water or broth. Meal planning  At meals, divide your plate into four equal parts: ? Fill one-half of your plate with vegetables and green salads. ? Fill one-fourth of your plate with whole grains. ? Fill one-fourth of your plate with lean protein foods.  Eat 4-5 servings of vegetables per day. A serving of vegetables is: ? 1 cup of raw or cooked vegetables. ? 2 cups of raw leafy greens.  Eat 4-5 servings of fruit per day. A serving of fruit is: ? 1  medium whole fruit. ?  cup of dried fruit. ?  cup of fresh, frozen, or canned fruit. ?  cup of 100% fruit juice.  Eat more foods that have soluble fiber. These are apples, broccoli, carrots, beans, peas, and barley. Try to get 20-30 g of fiber per day.  Eat 4-5 servings of nuts, legumes, and seeds per week: ? 1 serving of dried beans or legumes equals  cup after being cooked. ? 1 serving of nuts is  cup. ? 1 serving of seeds equals 1 tablespoon.   General information  Eat more home-cooked food. Eat less restaurant, buffet, and fast food.  Limit or avoid alcohol.  Limit foods that are high in starch and sugar.  Avoid fried foods.  Lose weight if you are overweight.  Keep track of how much salt (sodium) you eat. This is important if you have high blood pressure. Ask your doctor to tell you more about this.  Try to add vegetarian meals each week. Fats  Choose healthy fats. These include olive oil and canola oil, flaxseeds, walnuts, almonds, and seeds.  Eat more omega-3 fats. These include salmon, mackerel, sardines, tuna, flaxseed oil, and ground flaxseeds. Try to eat fish at least 2 times each week.  Check food labels. Avoid foods with trans fats or high amounts of saturated fat.  Limit saturated fats. ? These are  often found in animal products, such as meats, butter, and cream. ? These are also found in plant foods, such as palm oil, palm kernel oil, and coconut oil.  Avoid foods with partially hydrogenated oils in them. These have trans fats. Examples are stick margarine, some tub margarines, cookies, crackers, and other baked goods. What foods can I eat? Fruits All fresh, canned (in natural juice), or frozen fruits. Vegetables Fresh or frozen vegetables (raw, steamed, roasted, or grilled). Green salads. Grains Most grains. Choose whole wheat and whole grains most of the time. Rice and pasta, including brown rice and pastas made with whole wheat. Meats and other  proteins Lean, well-trimmed beef, veal, pork, and lamb. Chicken and [place] without skin. All fish and shellfish. Wild duck, rabbit, pheasant, and venison. Egg whites or low-cholesterol egg substitutes. Dried beans, peas, lentils, and tofu. Seeds and most nuts. Dairy Low-fat or nonfat cheeses, including ricotta and mozzarella. Skim or 1% milk that is liquid, powdered, or evaporated. Buttermilk that is made with low-fat milk. Nonfat or low-fat yogurt. Fats and oils Non-hydrogenated (trans-free) margarines. Vegetable oils, including soybean, sesame, sunflower, olive, peanut, safflower, corn, canola, and cottonseed. Salad dressings or mayonnaise made with a vegetable oil. Beverages Mineral water. Coffee and tea. Diet carbonated beverages. Sweets and desserts Sherbet, gelatin, and fruit ice. Small amounts of dark chocolate. Limit all sweets and desserts. Seasonings and condiments All seasonings and condiments. The items listed above may not be a complete list of foods and drinks you can eat. Contact a dietitian for more options. What foods should I avoid? Fruits Canned fruit in heavy syrup. Fruit in cream or butter sauce. Fried fruit. Limit coconut. Vegetables Vegetables cooked in cheese, cream, or butter sauce. Fried vegetables. Grains Breads that are made with saturated or trans fats, oils, or whole milk. Croissants. Sweet rolls. Donuts. High-fat crackers, such as cheese crackers. Meats and other proteins Fatty meats, such as hot dogs, ribs, sausage, bacon, rib-eye roast or steak. High-fat deli meats, such as salami and bologna. Caviar. Domestic duck and goose. Organ meats, such as liver. Dairy Cream, sour cream, cream cheese, and creamed cottage cheese. Whole-milk cheeses. Whole or 2% milk that is liquid, evaporated, or condensed. Whole buttermilk. Cream sauce or high-fat cheese sauce. Yogurt that is made from whole milk. Fats and oils Meat fat, or shortening. Cocoa butter, hydrogenated oils,  palm oil, coconut oil, palm kernel oil. Solid fats and shortenings, including bacon fat, salt pork, lard, and butter. Nondairy cream substitutes. Salad dressings with cheese or sour cream. Beverages Regular sodas and juice drinks with added sugar. Sweets and desserts Frosting. Pudding. Cookies. Cakes. Pies. Milk chocolate or white chocolate. Buttered syrups. Full-fat ice cream or ice cream drinks. The items listed above may not be a complete list of foods and drinks to avoid. Contact a dietitian for more information. Summary  Heart-healthy meal planning includes eating less unhealthy fats, eating more healthy fats, and making other changes in your diet.  Eat a balanced diet. This includes fruits and vegetables, low-fat or nonfat dairy, lean protein, nuts and legumes, whole grains, and heart-healthy oils and fats. This information is not intended to replace advice given to you by your health care provider. Make sure you discuss any questions you have with your health care provider. Document Revised: 10/02/2017 Document Reviewed: 09/05/2017 Elsevier Patient Education  2021 [place] refers to food and lifestyle choices that are based on the traditions of countries located on the [place]. This way of eating has been shown to help prevent certain conditions and improve outcomes for people who have chronic diseases, like kidney disease and heart disease. What are tips for following this plan? Lifestyle  Cook and eat meals together with your family, when possible.  Drink enough fluid to keep your urine clear or pale yellow.  Be physically active every day. This includes: ? Aerobic exercise like running or swimming. ? Leisure activities like gardening, walking, or housework.  Get 7-8 hours of sleep each night.  If recommended by your health care provider, drink red wine in moderation. This means 1 glass a day for nonpregnant women and 2  glasses a day for men. A glass of wine equals 5 oz (150 mL). Reading food labels  Check the serving size of packaged foods. For foods such as rice and pasta, the serving size refers to the amount of cooked product, not dry.  Check the total fat in packaged foods. Avoid foods that have saturated fat or trans fats.  Check the ingredients list for added sugars, such as corn syrup.   Shopping  At the grocery store, buy most of your food from the areas near the walls of the store. This includes: ? Fresh fruits and vegetables (produce). ? Grains, beans, nuts, and seeds. Some of these may be available in unpackaged forms or large amounts (in bulk). ? Fresh seafood. ? Poultry and eggs. ? Low-fat dairy products.  Buy whole ingredients instead of prepackaged foods.  Buy fresh fruits and vegetables in-season from local farmers markets.  Buy frozen fruits and vegetables in resealable bags.  If you do not have access to quality fresh seafood, buy precooked frozen shrimp or canned fish, such as tuna, salmon, or sardines.  Buy small amounts of raw or cooked vegetables, salads, or olives from the deli or salad bar at your store.  Stock your pantry so you always have certain foods on hand, such as olive oil, canned tuna, canned tomatoes, rice, pasta, and beans. Cooking  Cook foods with extra-virgin olive oil instead of using butter or other vegetable oils.  Have meat as a side dish, and have vegetables or grains as your main dish. This means having meat in small portions or adding small amounts of meat to foods like pasta or stew.  Use beans or vegetables instead of meat in common dishes like chili or lasagna.  Experiment with different cooking methods. Try roasting or broiling vegetables instead of steaming or sauteing them.  Add frozen vegetables to soups, stews, pasta, or rice.  Add nuts or seeds for added healthy fat at each meal. You can add these to yogurt, salads, or vegetable  dishes.  Marinate fish or vegetables using olive oil, lemon juice, garlic, and fresh herbs. Meal planning  Plan to eat 1 vegetarian meal one day each week. Try to work up to 2 vegetarian meals, if possible.  Eat seafood 2 or more times a week.  Have healthy snacks readily available, such as: ? Vegetable sticks with hummus. ? [place] yogurt. ? Fruit and nut trail mix.  Eat balanced meals throughout the week. This includes: ? Fruit: 2-3 servings a day ? Vegetables: 4-5 servings a day ? Low-fat dairy: 2 servings a day ? Fish, poultry, or lean meat: 1 serving a day ? Beans and legumes: 2 or more servings a week ? Nuts and seeds: 1-2 servings a day ? Whole grains: 6-8 servings a day ? Extra-virgin olive oil: 3-4 servings a  day  Limit red meat and sweets to only a few servings a month   What are my food choices?  Mediterranean diet ? Recommended  Grains: Whole-grain pasta. Brown rice. Bulgar wheat. Polenta. Couscous. Whole-wheat bread. [place].  Vegetables: Artichokes. Beets. Broccoli. Cabbage. Carrots. Eggplant. Green beans. Chard. Kale. Spinach. Onions. Leeks. Peas. Squash. Tomatoes. Peppers. Radishes.  Fruits: Apples. Apricots. Avocado. Berries. Bananas. Cherries. Dates. Figs. Grapes. Lemons. Melon. Oranges. Peaches. Plums. Pomegranate.  Meats and other protein foods: Beans. Almonds. Sunflower seeds. Pine nuts. Peanuts. [place]. Salmon. Scallops. Shrimp. [place]. Tilapia. Clams. Oysters. Eggs.  Dairy: Low-fat milk. Cheese. Greek yogurt.  Beverages: Water. Red wine. Herbal tea.  Fats and oils: Extra virgin olive oil. Avocado oil. Grape seed oil.  Sweets and desserts: [place] yogurt with honey. Baked apples. Poached pears. Trail mix.  Seasoning and other foods: Basil. Cilantro. Coriander. Cumin. Mint. Parsley. Sage. Rosemary. Tarragon. Garlic. Oregano. Thyme. Pepper. Balsalmic vinegar. Tahini. Hummus. Tomato sauce. Olives. Mushrooms. ? Limit these  Grains: Prepackaged pasta  or rice dishes. Prepackaged cereal with added sugar.  Vegetables: Deep fried potatoes (french fries).  Fruits: Fruit canned in syrup.  Meats and other protein foods: Beef. Pork. Lamb. Poultry with skin. Hot dogs. Bernie[place] Salines.  Dairy: Ice cream. Sour cream. Whole milk.  Beverages: Juice. Sugar-sweetened soft drinks. Beer. Liquor and spirits.  Fats and oils: Butter. Canola oil. Vegetable oil. Beef fat (tallow). Lard.  Sweets and desserts: Cookies. Cakes. Pies. Candy.  Seasoning and other foods: Mayonnaise. Premade sauces and marinades. The items listed may not be a complete list. Talk with your dietitian about what dietary choices are right for you. Summary  The Mediterranean diet includes both food and lifestyle choices.  Eat a variety of fresh fruits and vegetables, beans, nuts, seeds, and whole grains.  Limit the amount of red meat and sweets that you eat.  Talk with your health care provider about whether it is safe for you to drink red wine in moderation. This means 1 glass a day for nonpregnant women and 2 glasses a day for men. A glass of wine equals 5 oz (150 mL). This information is not intended to replace advice given to you by your health care provider. Make sure you discuss any questions you have with your health care provider. Document Revised: 03/28/2016 Document Reviewed: 03/21/2016 Elsevier Patient Education  [place].  Preventing High Cholesterol Cholesterol is a white, waxy substance similar to fat that the human body needs to help build cells. The liver makes all the cholesterol that a person's body needs. Having high cholesterol (hypercholesterolemia) increases your risk for heart disease and stroke. Extra or excess cholesterol comes from the food that you eat. High cholesterol can often be prevented with diet and lifestyle changes. If you already have high cholesterol, you can control it with diet, lifestyle changes, and medicines. How can high cholesterol  affect me? If you have high cholesterol, fatty deposits (plaques) may build up on the walls of your blood vessels. The blood vessels that carry blood away from your heart are called arteries. Plaques make the arteries narrower and stiffer. This in turn can:  Restrict or block blood flow and cause blood clots to form.  Increase your risk for heart attack and stroke. What can increase my risk for high cholesterol? This condition is more likely to develop in people who:  Eat foods that are high in saturated fat or cholesterol. Saturated fat is mostly found in foods that come from animal sources.  Are overweight.  Are not getting enough exercise.  Have a family history of high cholesterol (familial hypercholesterolemia). What actions can I take to prevent this? Nutrition  Eat less saturated fat.  Avoid trans fats (partially hydrogenated oils). These are often found in margarine and in some baked goods, fried foods, and snacks bought in packages.  Avoid precooked or cured meat, such as bacon, sausages, or meat loaves.  Avoid foods and drinks that have added sugars.  Eat more fruits, vegetables, and whole grains.  Choose healthy sources of protein, such as fish, poultry, lean cuts of red meat, beans, peas, lentils, and nuts.  Choose healthy sources of fat, such as: ? Nuts. ? Vegetable oils, especially olive oil. ? Fish that have healthy fats, such as omega-3 fatty acids. These fish include mackerel or salmon.   Lifestyle  Lose weight if you are overweight. Maintaining a healthy body mass index (BMI) can help prevent or control high cholesterol. It can also lower your risk for diabetes and high blood pressure. Ask your health care provider to help you with a diet and exercise plan to lose weight safely.  Do not use any products that contain nicotine or tobacco, such as cigarettes, e-cigarettes, and chewing tobacco. If you need help quitting, ask your health care provider. Alcohol  use  Do not drink alcohol if: ? Your health care provider tells you not to drink. ? You are pregnant, may be pregnant, or are planning to become pregnant.  If you drink alcohol: ? Limit how much you use to:  0-1 drink a day for women.  0-2 drinks a day for men. ? Be aware of how much alcohol is in your drink. In the [place] one drink equals one 12 oz bottle of beer (355 mL), one 5 oz glass of wine (148 mL), or one 1 oz glass of hard liquor (44 mL). Activity  Get enough exercise. Do exercises as told by your health care provider.  Each week, do at least 150 minutes of exercise that takes a medium level of effort (moderate-intensity exercise). This kind of exercise: ? Makes your heart beat faster while allowing you to still be able to talk. ? Can be done in short sessions several times a day or longer sessions a few times a week. For example, on 5 days each week, you could walk fast or ride your bike 3 times a day for 10 minutes each time.   Medicines  Your health care provider may recommend medicines to help lower cholesterol. This may be a medicine to lower the amount of cholesterol that your liver makes. You may need medicine if: ? Diet and lifestyle changes have not lowered your cholesterol enough. ? You have high cholesterol and other risk factors for heart disease or stroke.  Take over-the-counter and prescription medicines only as told by your health care provider. General information  Manage your risk factors for high cholesterol. Talk with your health care provider about all your risk factors and how to lower your risk.  Manage other conditions that you have, such as diabetes or high blood pressure (hypertension).  Have blood tests to check your cholesterol levels at regular points in time as told by your health care provider.  Keep all follow-up visits as told by your health care provider. This is important. Where to find more information  American Heart Association:  www.heart.org  National Heart, Lung, and Blood Institute: https://wilson-eaton.com/ Summary  High cholesterol increases your risk for heart disease and  stroke. By keeping your cholesterol level low, you can reduce your risk for these conditions.  High cholesterol can often be prevented with diet and lifestyle changes.  Work with your health care provider to manage your risk factors, and have your blood tested regularly. This information is not intended to replace advice given to you by your health care provider. Make sure you discuss any questions you have with your health care provider. Document Revised: 05/11/2019 Document Reviewed: 05/11/2019 Elsevier Patient Education  [place].

## 2020-12-28 NOTE — Progress Notes (Signed)
Established Patient Office Visit  Subjective:  Patient ID: Cathy Hamilton, female    DOB: [birth year]  Age: 45 y.o. MRN: [id number]  CC:  Chief Complaint  Patient Hamilton with  . Follow-up    Lab discussion    HPI Cathy Hamilton for a discussion of her recent LDL cholesterol 263.  7 years ago it was measured at 158.  Since that time it is trended upwards.  A statin was started but was discontinued after she became pregnant.  Baby is 2 months old and doing well.  She is breast-feeding.  Past Medical History:  Diagnosis Date  . Allergy   . GERD (gastroesophageal reflux disease)    with pregnancy-no meds  . Hyperlipidemia     Past Surgical History:  Procedure Laterality Date  . CESAREAN SECTION  2009, 2011  . CESAREAN SECTION  03/20/2012   Procedure: CESAREAN SECTION;  Surgeon: Darlyn Chamber, MD;  Location: [facility];  Service: Gynecology;  Laterality: N/A;  . CESAREAN SECTION N/A 10/25/2020   Procedure: REPEAT CESAREAN SECTION PREVIOUS X 3  EDC: 10-30-20 ALLERGIES: PENICILLINS;  Surgeon: Dian Queen, MD;  Location: [facility];  Service: Obstetrics;  Laterality: N/A;    Family History  Problem Relation Age of Onset  . Hyperlipidemia Mother   . Diabetes Mother   . Arthritis Mother   . Cataracts Mother   . Alcohol abuse Father   . Stroke Father   . Emphysema Father   . Hyperlipidemia Sister   . Anemia Sister   . Cancer Maternal Grandfather   . Hyperlipidemia Sister   . Anemia Sister     Social History   Socioeconomic History  . Marital status: Married    Spouse name: Not on file  . Number of children: Not on file  . Years of education: Not on file  . Highest education level: Not on file  Occupational History  . Not on file  Tobacco Use  . Smoking status: Never Smoker  . Smokeless tobacco: Never Used  Vaping Use  . Vaping Use: Never used  Substance and Sexual Activity  . Alcohol use: No  . Drug use: No  . Sexual activity: Yes    Birth  control/protection: Pill  Other Topics Concern  . Not on file  Social History Narrative  . Not on file   Social Determinants of Health   Financial Resource Strain: Not on file  Food Insecurity: Not on file  Transportation Needs: Not on file  Physical Activity: Not on file  Stress: Not on file  Social Connections: Not on file  Intimate Partner Violence: Not on file    Outpatient Medications Prior to Visit  Medication Sig Dispense Refill  . acetaminophen (TYLENOL) 325 MG tablet Take 2 tablets (650 mg total) by mouth every 4 (four) hours as needed for mild pain (temperature > 101.5.). 30 tablet 0  . ibuprofen (ADVIL) 800 MG tablet Take 1 tablet (800 mg total) by mouth every 8 (eight) hours. 30 tablet 0  . norethindrone (MICRONOR) 0.35 MG tablet Errin 0.35 mg tablet    . Prenatal Vit-Fe Fumarate-FA (PRENATAL MULTIVITAMIN) TABS tablet Take 1 tablet by mouth daily.     No facility-administered medications prior to visit.    Allergies  Allergen Reactions  . Penicillins Rash and Other (See Comments)    Childhood reaction    ROS Review of Systems  Constitutional: Negative.   Respiratory: Negative.   Cardiovascular: Negative.   Gastrointestinal: Negative.  Objective:    Physical Exam Constitutional:      Appearance: Normal appearance.  HENT:     Head: Normocephalic and atraumatic.  Eyes:     Conjunctiva/sclera: Conjunctivae normal.  Pulmonary:     Effort: Pulmonary effort is normal.  Neurological:     Mental Status: She is alert and oriented to person, place, and time.  Psychiatric:        Mood and Affect: Mood normal.        Behavior: Behavior normal.     Ht 5\' 7"  (1.702 m)   BMI 29.82 kg/m  Wt Readings from Last 3 Encounters:  12/20/20 190 lb 6.4 oz (86.4 kg)  12/13/20 189 lb 6.4 oz (85.9 kg)  10/25/20 211 lb (95.7 kg)     Health Maintenance Due  Topic Date Due  . Hepatitis C Screening  Never done  . COVID-19 Vaccine (3 - Booster for Janssen  series) 08/26/2020    There are no preventive care reminders to display for this patient.  Lab Results  Component Value Date   TSH 1.27 12/20/2020   Lab Results  Component Value Date   WBC 4.7 12/20/2020   HGB 14.8 12/20/2020   HCT 44.5 12/20/2020   MCV 83.8 12/20/2020   PLT 191.0 12/20/2020   Lab Results  Component Value Date   NA 139 12/20/2020   K 4.0 12/20/2020   CO2 30 12/20/2020   GLUCOSE 88 12/20/2020   BUN 16 12/20/2020   CREATININE 0.99 12/20/2020   BILITOT 0.5 12/20/2020   ALKPHOS 102 12/20/2020   AST 22 12/20/2020   ALT 32 12/20/2020   PROT 7.0 12/20/2020   ALBUMIN 4.3 12/20/2020   CALCIUM 9.4 12/20/2020   GFR 69.87 12/20/2020   Lab Results  Component Value Date   CHOL 345 (H) 12/20/2020   Lab Results  Component Value Date   HDL 58.60 12/20/2020   Lab Results  Component Value Date   LDLCALC 263 (H) 12/20/2020   Lab Results  Component Value Date   TRIG 115.0 12/20/2020   Lab Results  Component Value Date   CHOLHDL 6 12/20/2020   Lab Results  Component Value Date   HGBA1C 6.5 12/20/2020      Assessment & Plan:   Problem List Items Addressed This Visit   None   Visit Diagnoses    Elevated LDL cholesterol level    -  Primary   Relevant Orders   Lipid panel      No orders of the defined types were placed in this encounter.   Follow-up: No follow-ups on file.   Will hold statin therapy at this time because patient is breast-feeding.  She was given information on the Mediterranean diet managing high cholesterol.  She will work on lowering the fat and cholesterol in her diet.  LDL has been down to 158 in the past.  When the baby starts to wean she will return for repeat lipid profile and we will make a decision about statin therapy at that time. Libby Maw, MD   Virtual Visit via Video Note  I connected with Cathy Hamilton on 12/28/20 at 11:00 AM EDT by a video enabled telemedicine application and verified that I am  speaking with the correct person using two identifiers.  Location: Patient: home with her baby.  Provider: work   I discussed the limitations of evaluation and management by telemedicine and the availability of in person appointments. The patient expressed understanding and agreed to proceed.  History of Present Illness:    Observations/Objective:   Assessment and Plan:   Follow Up Instructions:    I discussed the assessment and treatment plan with the patient. The patient was provided an opportunity to ask questions and all were answered. The patient agreed with the plan and demonstrated an understanding of the instructions.   The patient was advised to call back or seek an in-person evaluation if the symptoms worsen or if the condition fails to improve as anticipated.  I provided 25 minutes of non-face-to-face time during this encounter.   Libby Maw, MD

## 2021-03-26 ENCOUNTER — Ambulatory Visit
Admission: EM | Admit: 2021-03-26 | Discharge: 2021-03-26 | Disposition: A | Discharge status: Home / Self Care | Payer: Medicaid Other

## 2021-03-26 ENCOUNTER — Encounter: Payer: Self-pay | Admitting: Emergency Medicine

## 2021-03-26 ENCOUNTER — Other Ambulatory Visit: Payer: Self-pay

## 2021-03-26 DIAGNOSIS — R52 Pain, unspecified: Secondary | ICD-10-CM | POA: Diagnosis not present

## 2021-03-26 DIAGNOSIS — R519 Headache, unspecified: Secondary | ICD-10-CM

## 2021-03-26 NOTE — ED Provider Notes (Signed)
[facility]    CSN: [id number] Arrival date & time: 03/26/21  1225      History   Chief Complaint Chief Complaint  Patient presents with   Headache    HPI Cathy Hamilton is a 45 y.o. female presenting today for evaluation of a headache.  Reports tension headache x3 days.  Radiating into neck and wrapping around head.  Has slightly eased off today.  Patient was concerned as this morning she did feel a brief twitching sensation in her right face.  This has not recurred throughout the day.  Denies any vision changes.  Denies chest pain.  Denies any difficulty speaking, one-sided weakness or numbness or tingling.  Does report history of migraines.   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   Muscle fasciculation 11/23/2019   Healthcare maintenance 11/23/2019   Elevated cholesterol 10/03/2012   Previous cesarean delivery affecting pregnancy 03/21/2012    Class: Present on Admission   S/P cesarean section 03/21/2012    Class: Status post    Past Surgical History:  Procedure Laterality Date   CESAREAN SECTION  2009, 2011   CESAREAN SECTION  03/20/2012   Procedure: CESAREAN SECTION;  Surgeon: Darlyn Chamber, MD;  Location: [facility];  Service: Gynecology;  Laterality: N/A;   CESAREAN SECTION N/A 10/25/2020   Procedure: REPEAT CESAREAN SECTION PREVIOUS X 3  EDC: 10-30-20 ALLERGIES: PENICILLINS;  Surgeon: Dian Queen, MD;  Location: [facility];  Service: Obstetrics;  Laterality: N/A;    OB History     Gravida  6   Para  4   Term  3   Preterm      AB  2   Living  4      SAB      IAB   2   Ectopic      Multiple  0   Live Births  4            Home Medications    Prior to Admission medications   Medication Sig Start Date End Date Taking? Authorizing Provider  Prenatal Vit-Fe Fumarate-FA (PRENATAL MULTIVITAMIN) TABS tablet Take 1 tablet by mouth daily.   Yes [provider]  acetaminophen (TYLENOL) 325 MG tablet Take 2 tablets (650 mg total) by mouth every 4 (four) hours as needed for mild pain (temperature > 101.5.). 10/28/20   [name], MD  ibuprofen (ADVIL) 800 MG tablet Take 1 tablet (800 mg total) by mouth every 8 (eight) hours. 10/28/20   [name], MD  norethindrone (MICRONOR) 0.35 MG tablet Errin 0.35 mg tablet    [provider]    Family History Family History  Problem Relation Age of Onset   Hyperlipidemia Mother    Diabetes Mother    Arthritis Mother    Cataracts Mother    Alcohol abuse Father    Stroke Father    Emphysema Father    Hyperlipidemia Sister    Anemia Sister    Cancer Maternal Grandfather    Hyperlipidemia Sister    Anemia Sister  Social History Social History   Tobacco Use   Smoking status: Never   Smokeless tobacco: Never  Vaping Use   Vaping Use: Never used  Substance Use Topics   Alcohol use: No   Drug use: No     Allergies   Other and Penicillins   Review of Systems Review of Systems  Constitutional:  Negative for fatigue and fever.  HENT:  Negative for congestion, sinus pressure and sore throat.   Eyes:  Negative for photophobia, pain and visual disturbance.  Respiratory:  Negative for cough and shortness of breath.   Cardiovascular:  Negative for chest pain.  Gastrointestinal:  Negative for abdominal pain, nausea and vomiting.  Genitourinary:  Negative for decreased urine volume and hematuria.  Musculoskeletal:  Negative for myalgias, neck pain and neck stiffness.  Neurological:  Positive for headaches. Negative for dizziness, syncope, facial asymmetry, speech  difficulty, weakness, light-headedness and numbness.    Physical Exam Triage Vital Signs ED Triage Vitals  Enc Vitals Group     BP      Pulse      Resp      Temp      Temp src      SpO2      Weight      Height      Head Circumference      Peak Flow      Pain Score      Pain Loc      Pain Edu?      Excl. in [place]?    No data found.  Updated Vital Signs BP 121/77 (BP Location: Right Arm)   Pulse 86   Temp 98.1 F (36.7 C)   Resp 14   LMP 03/09/2021 (Approximate)   SpO2 97%   Breastfeeding Yes   Visual Acuity Right Eye Distance:   Left Eye Distance:   Bilateral Distance:    Right Eye Near:   Left Eye Near:    Bilateral Near:     Physical Exam Vitals and nursing note reviewed.  Constitutional:      Appearance: She is well-developed.     Comments: No acute distress  HENT:     Head: Normocephalic and atraumatic.     Ears:     Comments: Bilateral ears without tenderness to palpation of external auricle, tragus and mastoid, EAC's without erythema or swelling, TM's with good bony landmarks and cone of light. Non erythematous.      Nose: Nose normal.     Mouth/Throat:     Comments: Oral mucosa pink and moist, no tonsillar enlargement or exudate. Posterior pharynx patent and nonerythematous, no uvula deviation or swelling. Normal phonation.  Eyes:     Conjunctiva/sclera: Conjunctivae normal.  Cardiovascular:     Rate and Rhythm: Normal rate.  Pulmonary:     Effort: Pulmonary effort is normal. No respiratory distress.     Comments: Breathing comfortably at rest, CTABL, no wheezing, rales or other adventitious sounds auscultated  Abdominal:     General: There is no distension.  Musculoskeletal:        General: Normal range of motion.     Cervical back: Neck supple.  Skin:    General: Skin is warm and dry.  Neurological:     Mental Status: She is alert and oriented to person, place, and time.     UC Treatments / Results  Labs (all labs ordered are  listed, but only abnormal results are displayed) Labs Reviewed - No data to display  EKG   Radiology No results found.  Procedures Procedures (including critical care time)  Medications Ordered in UC Medications - No data to display  Initial Impression / Assessment and Plan / UC Course  I have reviewed the triage vital signs and the nursing notes.  Pertinent labs & imaging results that were available during my care of the patient were reviewed by me and considered in my medical decision making (see chart for details).     Exam reassuring, vital signs stable, no neurodeficits, discussed with patient and provided reassurance.  Recommended to continue symptomatic and supportive care of headache, body aches, hydration and fluids.  Discussed strict return precautions. Patient verbalized understanding and is agreeable with plan.  Final Clinical Impressions(s) / UC Diagnoses   Final diagnoses:  Acute nonintractable headache, unspecified headache type  Generalized body aches     Discharge Instructions      Rest and drink plenty of fluids Tylenol/ibuprofen or naproxen as needed for headaches, body aches Please follow-up if developing any severe headaches, vision changes, difficulty speaking, facial drooping, one-sided weakness, chest pain, significant dizziness or lightheadedness     ED Prescriptions   None    PDMP not reviewed this encounter.   Janith Lima, PA-C 03/26/21 1606

## 2021-03-26 NOTE — ED Triage Notes (Addendum)
Patient c/o headache x 4 days.   Patient endorses " I have some mouth twitching and thumb twitching at times".   Patient endorses body aches and fatigue at times.   Patient denies visual changes or any other deficits.   Patient is currently breastfeeding.   Patient took Naproxen and Aspirin w/ some relief of symptoms.

## 2021-03-26 NOTE — Discharge Instructions (Addendum)
Rest and drink plenty of fluids Tylenol/ibuprofen or naproxen as needed for headaches, body aches Please follow-up if developing any severe headaches, vision changes, difficulty speaking, facial drooping, one-sided weakness, chest pain, significant dizziness or lightheadedness

## 2021-03-27 ENCOUNTER — Ambulatory Visit (INDEPENDENT_AMBULATORY_CARE_PROVIDER_SITE_OTHER): Payer: Medicaid Other | Admitting: Family Medicine

## 2021-03-27 ENCOUNTER — Encounter: Payer: Self-pay | Admitting: Family Medicine

## 2021-03-27 VITALS — BP 115/76 | HR 85 | Temp 97.2°F | Ht 67.0 in | Wt 187.0 lb

## 2021-03-27 DIAGNOSIS — E7800 Pure hypercholesterolemia, unspecified: Secondary | ICD-10-CM

## 2021-03-27 DIAGNOSIS — Z789 Other specified health status: Secondary | ICD-10-CM | POA: Diagnosis not present

## 2021-03-27 DIAGNOSIS — Z0000 Encounter for general adult medical examination without abnormal findings: Secondary | ICD-10-CM

## 2021-03-27 DIAGNOSIS — G43009 Migraine without aura, not intractable, without status migrainosus: Secondary | ICD-10-CM | POA: Diagnosis not present

## 2021-03-27 LAB — LIPID PANEL
Cholesterol: 337 mg/dL — ABNORMAL HIGH (ref 0–200)
HDL: 57 mg/dL (ref >39.00)
LDL Cholesterol: 265 mg/dL — ABNORMAL HIGH (ref 0–99)
NonHDL: 280.4
Total CHOL/HDL Ratio: 6
Triglycerides: 75 mg/dL (ref 0.0–149.0)
VLDL: 15 mg/dL (ref 0.0–40.0)

## 2021-03-27 LAB — HEMOGLOBIN A1C: Hgb A1c MFr Bld: 5.8 % (ref 4.6–6.5)

## 2021-03-27 LAB — VITAMIN D 25 HYDROXY (VIT D DEFICIENCY, FRACTURES): VITD: 29.71 ng/mL — ABNORMAL LOW (ref 30.00–100.00)

## 2021-03-27 LAB — VITAMIN B12: Vitamin B-12: 853 pg/mL (ref 211–911)

## 2021-03-27 LAB — LDL CHOLESTEROL, DIRECT: Direct LDL: 248 mg/dL

## 2021-03-27 MED ORDER — SUMATRIPTAN SUCCINATE 50 MG PO TABS: 50.0000 mg | ORAL_TABLET | ORAL | 0 refills | Status: DC | PRN

## 2021-03-27 MED ORDER — ATORVASTATIN CALCIUM 20 MG PO TABS: 20.0000 mg | ORAL_TABLET | Freq: Every day | ORAL | 1 refills | Status: DC

## 2021-03-27 NOTE — Progress Notes (Signed)
Established Patient Office Visit  Subjective:  Patient ID: Cathy Hamilton, female    DOB: 06/02/1976  Age: 45 y.o. MRN: [id number]  CC:  Chief Complaint  Patient presents with   Headache    Follow up from urgent care seen for headaches, lip twitching a little yesterday patient would also like to discuss starting on cholesterol medication.     HPI Cathy Hamilton presents for follow-up of her elevated ldl cholesterol.  She has started to wean her new born son who is doing quite well and is ready to restart her cholesterol medication.  She has adopted a vegan diet to a large extent.  Occasionally consumes fish for protein.  She was negative for gestational diabetes through  a  Glucola test, but due to the baby's birthweight it had been suspected.  Her mom has elevated LDL and diabetes.  History of migraine headaches.  There are certain foods smells and noises that can set them off for her.  A pounding can occur about her head.  There is no nausea or vomiting.  They can last for days.  She has taken Imitrex in the past with good result.  She does experience springtime allergies that have been controlled to date with Claritin.  She is accompanied by her beautiful newborn son.  Past Medical History:  Diagnosis Date   Allergy    GERD (gastroesophageal reflux disease)    with pregnancy-no meds   Hyperlipidemia     Past Surgical History:  Procedure Laterality Date   CESAREAN SECTION  2009, 2011   CESAREAN SECTION  03/20/2012   Procedure: CESAREAN SECTION;  Surgeon: Darlyn Chamber, MD;  Location: [facility];  Service: Gynecology;  Laterality: N/A;   CESAREAN SECTION N/A 10/25/2020   Procedure: REPEAT CESAREAN SECTION PREVIOUS X 3  EDC: 10-30-20 ALLERGIES: PENICILLINS;  Surgeon: Dian Queen, MD;  Location: [facility];  Service: Obstetrics;  Laterality: N/A;    Family History  Problem Relation Age of Onset   Hyperlipidemia Mother    Diabetes Mother    Arthritis Mother    Cataracts  Mother    Alcohol abuse Father    Stroke Father    Emphysema Father    Hyperlipidemia Sister    Anemia Sister    Cancer Maternal Grandfather    Hyperlipidemia Sister    Anemia Sister     Social History   Socioeconomic History   Marital status: Married    Spouse name: Not on file   Number of children: Not on file   Years of education: Not on file   Highest education level: Not on file  Occupational History   Not on file  Tobacco Use   Smoking status: Never   Smokeless tobacco: Never  Vaping Use   Vaping Use: Never used  Substance and Sexual Activity   Alcohol use: No   Drug use: No   Sexual activity: Yes    Birth control/protection: Pill  Other Topics Concern   Not on file  Social History Narrative   Not on file   Social Determinants of Health   Financial Resource Strain: Not on file  Food Insecurity: Not on file  Transportation Needs: Not on file  Physical Activity: Not on file  Stress: Not on file  Social Connections: Not on file  Intimate Partner Violence: Not on file    Outpatient Medications Prior to Visit  Medication Sig Dispense Refill   ibuprofen (ADVIL) 800 MG tablet Take 1 tablet (800  mg total) by mouth every 8 (eight) hours. 30 tablet 0   norethindrone (MICRONOR) 0.35 MG tablet Errin 0.35 mg tablet     Prenatal Vit-Fe Fumarate-FA (PRENATAL MULTIVITAMIN) TABS tablet Take 1 tablet by mouth daily.     acetaminophen (TYLENOL) 325 MG tablet Take 2 tablets (650 mg total) by mouth every 4 (four) hours as needed for mild pain (temperature > 101.5.). (Patient not taking: Reported on 03/27/2021) 30 tablet 0   No facility-administered medications prior to visit.    Allergies  Allergen Reactions   Other Itching    "Tumeric causes my lips to itch and break out".    Penicillins Rash and Other (See Comments)    Childhood reaction    ROS Review of Systems  Constitutional:  Negative for chills, diaphoresis, fatigue, fever and unexpected weight change.   HENT: Negative.    Eyes:  Negative for photophobia and visual disturbance.  Respiratory: Negative.    Cardiovascular: Negative.   Gastrointestinal: Negative.  Negative for nausea and vomiting.  Genitourinary: Negative.   Neurological:  Positive for headaches.     Objective:    Physical Exam Vitals and nursing note reviewed.  Constitutional:      General: She is not in acute distress.    Appearance: She is well-developed and normal weight. She is not ill-appearing, toxic-appearing or diaphoretic.  HENT:     Head: Normocephalic and atraumatic.  Eyes:     Extraocular Movements: Extraocular movements intact.  Cardiovascular:     Rate and Rhythm: Normal rate and regular rhythm.  Pulmonary:     Effort: Pulmonary effort is normal.     Breath sounds: Normal breath sounds.  Neurological:     Mental Status: She is alert and oriented to person, place, and time.  Psychiatric:        Mood and Affect: Mood normal.        Behavior: Behavior normal.    BP 115/76 (BP Location: Left Arm, Patient Position: Sitting, Cuff Size: Normal)   Pulse 85   Temp (!) 97.2 F (36.2 C) (Temporal)   Ht '5\' 7"'$  (1.702 m)   Wt 187 lb (84.8 kg)   LMP 03/09/2021 (Approximate)   SpO2 97%   BMI 29.29 kg/m  Wt Readings from Last 3 Encounters:  03/27/21 187 lb (84.8 kg)  12/20/20 190 lb 6.4 oz (86.4 kg)  12/13/20 189 lb 6.4 oz (85.9 kg)     Health Maintenance Due  Topic Date Due   Pneumococcal Vaccine 17-79 Years old (1 - PCV) Never done   Hepatitis C Screening  Never done   INFLUENZA VACCINE  03/12/2021    There are no preventive care reminders to display for this patient.  Lab Results  Component Value Date   TSH 1.27 12/20/2020   Lab Results  Component Value Date   WBC 4.7 12/20/2020   HGB 14.8 12/20/2020   HCT 44.5 12/20/2020   MCV 83.8 12/20/2020   PLT 191.0 12/20/2020   Lab Results  Component Value Date   NA 139 12/20/2020   K 4.0 12/20/2020   CO2 30 12/20/2020   GLUCOSE 88  12/20/2020   BUN 16 12/20/2020   CREATININE 0.99 12/20/2020   BILITOT 0.5 12/20/2020   ALKPHOS 102 12/20/2020   AST 22 12/20/2020   ALT 32 12/20/2020   PROT 7.0 12/20/2020   ALBUMIN 4.3 12/20/2020   CALCIUM 9.4 12/20/2020   GFR 69.87 12/20/2020   Lab Results  Component Value Date   CHOL 345 (  H) 12/20/2020   Lab Results  Component Value Date   HDL 58.60 12/20/2020   Lab Results  Component Value Date   LDLCALC 263 (H) 12/20/2020   Lab Results  Component Value Date   TRIG 115.0 12/20/2020   Lab Results  Component Value Date   CHOLHDL 6 12/20/2020   Lab Results  Component Value Date   HGBA1C 6.5 12/20/2020      Assessment & Plan:   Problem List Items Addressed This Visit       Other   Healthcare maintenance   Relevant Orders   Hemoglobin A1c   Other Visit Diagnoses     Elevated LDL cholesterol level    -  Primary   Relevant Medications   atorvastatin (LIPITOR) 20 MG tablet   Other Relevant Orders   Lipid panel   LDL cholesterol, direct   Vegan diet       Relevant Orders   Vitamin B12   Iron, TIBC and Ferritin Panel   VITAMIN D 25 Hydroxy (Vit-D Deficiency, Fractures)   Migraine without aura and without status migrainosus, not intractable       Relevant Medications   atorvastatin (LIPITOR) 20 MG tablet   SUMAtriptan (IMITREX) 50 MG tablet       Meds ordered this encounter  Medications   DISCONTD: SUMAtriptan (IMITREX) 50 MG tablet    Sig: Take 1 tablet (50 mg total) by mouth every 2 (two) hours as needed for migraine. May repeat in 2 hours if headache persists or recurs.    Dispense:  10 tablet    Refill:  0   atorvastatin (LIPITOR) 20 MG tablet    Sig: Take 1 tablet (20 mg total) by mouth daily.    Dispense:  90 tablet    Refill:  1   SUMAtriptan (IMITREX) 50 MG tablet    Sig: Take 1 tablet (50 mg total) by mouth every 2 (two) hours as needed for migraine. May repeat in 2 hours if headache persists or recurs. No more than 2 each day.     Dispense:  10 tablet    Refill:  0    Follow-up: Return in about 3 months (around 06/27/2021).    Libby Maw, MD

## 2021-03-28 ENCOUNTER — Telehealth: Payer: Self-pay

## 2021-03-28 LAB — IRON,TIBC AND FERRITIN PANEL
%SAT: 26 % (calc) (ref 16–45)
Ferritin: 41 ng/mL (ref 16–232)
Iron: 80 ug/dL (ref 40–190)
TIBC: 312 mcg/dL (calc) (ref 250–450)

## 2021-03-28 NOTE — Telephone Encounter (Signed)
Patient would like to know how she should take medications daily. She wants to know if the statin will interact with her birth control medication and if she should take th medications at different times during the day?

## 2021-04-05 NOTE — Telephone Encounter (Signed)
Patient notified and verbalized understanding. 

## 2021-05-06 ENCOUNTER — Encounter (HOSPITAL_COMMUNITY): Payer: Self-pay | Admitting: Emergency Medicine

## 2021-05-06 ENCOUNTER — Other Ambulatory Visit: Payer: Self-pay

## 2021-05-06 ENCOUNTER — Emergency Department (HOSPITAL_COMMUNITY)
Admission: EM | Admit: 2021-05-06 | Discharge: 2021-05-07 | Disposition: A | Discharge status: Home / Self Care | Payer: Medicaid Other | Attending: Emergency Medicine | Admitting: Emergency Medicine

## 2021-05-06 DIAGNOSIS — S46812A Strain of other muscles, fascia and tendons at shoulder and upper arm level, left arm, initial encounter: Secondary | ICD-10-CM | POA: Insufficient documentation

## 2021-05-06 DIAGNOSIS — M542 Cervicalgia: Secondary | ICD-10-CM | POA: Diagnosis not present

## 2021-05-06 DIAGNOSIS — Y92410 Unspecified street and highway as the place of occurrence of the external cause: Secondary | ICD-10-CM | POA: Insufficient documentation

## 2021-05-06 DIAGNOSIS — S46819A Strain of other muscles, fascia and tendons at shoulder and upper arm level, unspecified arm, initial encounter: Secondary | ICD-10-CM

## 2021-05-06 DIAGNOSIS — S46811A Strain of other muscles, fascia and tendons at shoulder and upper arm level, right arm, initial encounter: Secondary | ICD-10-CM | POA: Diagnosis not present

## 2021-05-06 DIAGNOSIS — S4991XA Unspecified injury of right shoulder and upper arm, initial encounter: Secondary | ICD-10-CM | POA: Diagnosis present

## 2021-05-06 NOTE — Discharge Instructions (Signed)
Use Tylenol every 4 hours, Motrin every 6 hours, ice and heat as needed for pain control. Return for new or worsening signs or symptoms such as numbness or tingling down arms or legs, uncontrolled pain, difficulty with ambulating or new concerns.
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
## 2021-05-08 ENCOUNTER — Telehealth: Payer: Self-pay

## 2021-05-08 NOTE — Telephone Encounter (Signed)
Transition Care Management Follow-up Telephone Call Date of discharge and from where: 05/07/2021-[facility]  How have you been since you were released from the hospital? Patient stated she is getting better.  Any questions or concerns? No  Items Reviewed: Did the pt receive and understand the discharge instructions provided? Yes  Medications obtained and verified?  No medication given at discharge  Other? No  Any new allergies since your discharge? No  Dietary orders reviewed? N/A Do you have support at home? Yes   Home Care and Equipment/Supplies: Were home health services ordered? not applicable If so, what is the name of the agency? N/A  Has the agency set up a time to come to the patient's home? not applicable Were any new equipment or medical supplies ordered?  No What is the name of the medical supply agency? N/A Were you able to get the supplies/equipment? not applicable Do you have any questions related to the use of the equipment or supplies? No  Functional Questionnaire: (I = Independent and D = Dependent) ADLs: I  Bathing/Dressing- I  Meal Prep- I  Eating- I  Maintaining continence- I  Transferring/Ambulation- I  Managing Meds- I  Follow up appointments reviewed:  PCP Hospital f/u appt confirmed? Yes  Scheduled to see Dr. Ethelene Hal on 05/09/2021 @ 8:00 am. [facility] f/u appt confirmed? No   Are transportation arrangements needed? No  If their condition worsens, is the pt aware to call PCP or go to the Emergency Dept.? Yes Was the patient provided with contact information for the PCP's office or ED? Yes Was to pt encouraged to call back with questions or concerns? Yes

## 2021-05-09 ENCOUNTER — Encounter: Payer: Self-pay | Admitting: Family Medicine

## 2021-05-09 ENCOUNTER — Ambulatory Visit (INDEPENDENT_AMBULATORY_CARE_PROVIDER_SITE_OTHER): Payer: Medicaid Other | Admitting: Family Medicine

## 2021-05-09 ENCOUNTER — Ambulatory Visit (INDEPENDENT_AMBULATORY_CARE_PROVIDER_SITE_OTHER): Payer: Medicaid Other

## 2021-05-09 ENCOUNTER — Other Ambulatory Visit: Payer: Self-pay

## 2021-05-09 VITALS — BP 112/78 | HR 91 | Temp 97.0°F | Ht 67.0 in | Wt 183.0 lb

## 2021-05-09 DIAGNOSIS — Z23 Encounter for immunization: Secondary | ICD-10-CM

## 2021-05-09 DIAGNOSIS — S29019A Strain of muscle and tendon of unspecified wall of thorax, initial encounter: Secondary | ICD-10-CM | POA: Diagnosis not present

## 2021-05-09 DIAGNOSIS — E7800 Pure hypercholesterolemia, unspecified: Secondary | ICD-10-CM

## 2021-05-09 DIAGNOSIS — R202 Paresthesia of skin: Secondary | ICD-10-CM

## 2021-05-09 LAB — B12 AND FOLATE PANEL
Folate: 19 ng/mL (ref >5.9)
Vitamin B-12: 640 pg/mL (ref 211–911)

## 2021-05-09 LAB — TSH: TSH: 1.42 u[IU]/mL (ref 0.35–5.50)

## 2021-05-09 MED ORDER — METHOCARBAMOL 500 MG PO TABS: 500.0000 mg | ORAL_TABLET | Freq: Three times a day (TID) | ORAL | 0 refills | Status: DC | PRN
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
## 2021-05-31 ENCOUNTER — Other Ambulatory Visit: Payer: Self-pay

## 2021-05-31 ENCOUNTER — Telehealth: Payer: Self-pay | Admitting: Family Medicine

## 2021-05-31 ENCOUNTER — Encounter: Payer: Medicaid Other | Admitting: Family Medicine

## 2021-05-31 NOTE — Telephone Encounter (Signed)
Pt had an appointment with Dr. Ethelene Hal today 05/31/21 at 3:00. She stated she had waited on the phone till I picked up at 3:15, I checked her in quickly. I had to reschedule her appointment and told her about the late fee. She was not happy with that and does not want to be charged. She stated her child care was the issue and she had no control. I informed her, we needed 24hrs before hand not to be charged, she wants a call back from a Freight forwarder. Please advise

## 2021-05-31 NOTE — Progress Notes (Signed)
I do not have any reasonThis encounter was created in error - please disregard.

## 2021-06-05 ENCOUNTER — Other Ambulatory Visit: Payer: Self-pay

## 2021-06-06 ENCOUNTER — Encounter: Payer: Self-pay | Admitting: Family Medicine

## 2021-06-06 ENCOUNTER — Ambulatory Visit (INDEPENDENT_AMBULATORY_CARE_PROVIDER_SITE_OTHER): Payer: Medicaid Other | Admitting: Family Medicine

## 2021-06-06 VITALS — BP 110/68 | HR 87 | Temp 97.1°F | Ht 67.0 in | Wt 180.4 lb

## 2021-06-06 DIAGNOSIS — S29019D Strain of muscle and tendon of unspecified wall of thorax, subsequent encounter: Secondary | ICD-10-CM

## 2021-06-06 DIAGNOSIS — R0982 Postnasal drip: Secondary | ICD-10-CM

## 2021-06-06 DIAGNOSIS — J988 Other specified respiratory disorders: Secondary | ICD-10-CM

## 2021-06-06 DIAGNOSIS — E7800 Pure hypercholesterolemia, unspecified: Secondary | ICD-10-CM | POA: Diagnosis not present

## 2021-06-06 HISTORY — DX: Postnasal drip: R09.82

## 2021-06-06 HISTORY — DX: Other specified respiratory disorders: J98.8

## 2021-06-06 MED ORDER — MOMETASONE FUROATE 50 MCG/ACT NA SUSP
2.0000 | Freq: Every day | NASAL | 3 refills | Status: DC
Start: 2021-06-06 — End: 2021-09-10

## 2021-06-06 NOTE — Progress Notes (Signed)
Established Patient Office Visit  Subjective:  Patient ID: Cathy Hamilton, female    DOB: 20-Oct-1975  Age: 45 y.o. MRN: [id number]  CC:  Chief Complaint  Patient presents with  . Follow-up    Follow up on neck pains. No concerns.     HPI Cathy Hamilton presents for follow-up of thoracic back strain and some nasal congestion that she is been experiencing since the weather has turned colder night.  She has had a scratchy throat with postnasal drip.  Ears have been congested.  Denies fevers or fever chills cough difficulty breathing or wheezing.  No asthma history.  She does not smoke.  No pets in the home.  Upper back strain is doing better.  Red rarely bothers her now.  No particular problems taking the low-dose atorvastatin.  Past Medical History:  Diagnosis Date  . Allergy   . GERD (gastroesophageal reflux disease)    with pregnancy-no meds  . Hyperlipidemia     Past Surgical History:  Procedure Laterality Date  . CESAREAN SECTION  2009, 2011  . CESAREAN SECTION  03/20/2012   Procedure: CESAREAN SECTION;  Surgeon: Darlyn Chamber, MD;  Location: [facility];  Service: Gynecology;  Laterality: N/A;  . CESAREAN SECTION N/A 10/25/2020   Procedure: REPEAT CESAREAN SECTION PREVIOUS X 3  EDC: 10-30-20 ALLERGIES: PENICILLINS;  Surgeon: Dian Queen, MD;  Location: [facility];  Service: Obstetrics;  Laterality: N/A;    Family History  Problem Relation Age of Onset  . Hyperlipidemia Mother   . Diabetes Mother   . Arthritis Mother   . Cataracts Mother   . Alcohol abuse Father   . Stroke Father   . Emphysema Father   . Hyperlipidemia Sister   . Anemia Sister   . Cancer Maternal Grandfather   . Hyperlipidemia Sister   . Anemia Sister     Social History   Socioeconomic History  . Marital status: Married    Spouse name: Not on file  . Number of children: Not on file  . Years of education: Not on file  . Highest education level: Not on file  Occupational History  . Not  on file  Tobacco Use  . Smoking status: Never  . Smokeless tobacco: Never  Vaping Use  . Vaping Use: Never used  Substance and Sexual Activity  . Alcohol use: No  . Drug use: No  . Sexual activity: Yes    Birth control/protection: Pill  Other Topics Concern  . Not on file  Social History Narrative  . Not on file   Social Determinants of Health   Financial Resource Strain: Not on file  Food Insecurity: Not on file  Transportation Needs: Not on file  Physical Activity: Not on file  Stress: Not on file  Social Connections: Not on file  Intimate Partner Violence: Not on file    Outpatient Medications Prior to Visit  Medication Sig Dispense Refill  . atorvastatin (LIPITOR) 20 MG tablet Take 1 tablet (20 mg total) by mouth daily. 90 tablet 1  . cholecalciferol (VITAMIN D3) 25 MCG (1000 UNIT) tablet Take 2,000 Units by mouth daily.    Marland Kitchen ibuprofen (ADVIL) 800 MG tablet Take 1 tablet (800 mg total) by mouth every 8 (eight) hours. 30 tablet 0  . methocarbamol (ROBAXIN) 500 MG tablet Take 1 tablet (500 mg total) by mouth every 8 (eight) hours as needed for muscle spasms. 40 tablet 0  . norethindrone (MICRONOR) 0.35 MG tablet     .  Prenatal Vit-Fe Fumarate-FA (PRENATAL MULTIVITAMIN) TABS tablet Take 1 tablet by mouth daily.    . SUMAtriptan (IMITREX) 50 MG tablet Take 1 tablet (50 mg total) by mouth every 2 (two) hours as needed for migraine. May repeat in 2 hours if headache persists or recurs. No more than 2 each day. 10 tablet 0   No facility-administered medications prior to visit.    Allergies  Allergen Reactions  . Other Itching    "Tumeric causes my lips to itch and break out".   . Penicillins Rash and Other (See Comments)    Childhood reaction    ROS Review of Systems  Constitutional:  Negative for chills, diaphoresis, fatigue, fever and unexpected weight change.  HENT:  Positive for congestion and postnasal drip. Negative for ear pain, sinus pressure and sinus pain.    Eyes:  Negative for photophobia and visual disturbance.  Respiratory: Negative.  Negative for cough, shortness of breath and wheezing.   Cardiovascular: Negative.   Gastrointestinal: Negative.   Endocrine: Negative for polyphagia and polyuria.  Musculoskeletal:  Negative for back pain and myalgias.  Neurological:  Negative for speech difficulty and weakness.     Objective:    Physical Exam Vitals and nursing note reviewed.  Constitutional:      General: She is not in acute distress.    Appearance: Normal appearance. She is not ill-appearing, toxic-appearing or diaphoretic.  HENT:     Head: Normocephalic and atraumatic.     Right Ear: Tympanic membrane, ear canal and external ear normal.     Left Ear: Tympanic membrane, ear canal and external ear normal.     Nose: Septal deviation present. No nasal deformity, mucosal edema or rhinorrhea.      Mouth/Throat:     Mouth: Mucous membranes are moist.     Pharynx: Oropharynx is clear. No oropharyngeal exudate or posterior oropharyngeal erythema.  Eyes:     General: No scleral icterus.       Right eye: No discharge.        Left eye: No discharge.     Extraocular Movements: Extraocular movements intact.     Conjunctiva/sclera: Conjunctivae normal.     Pupils: Pupils are equal, round, and reactive to light.  Cardiovascular:     Rate and Rhythm: Normal rate and regular rhythm.  Pulmonary:     Effort: Pulmonary effort is normal.     Breath sounds: Normal breath sounds. No wheezing.  Musculoskeletal:     Cervical back: No rigidity or tenderness.  Lymphadenopathy:     Cervical: No cervical adenopathy.  Skin:    General: Skin is warm and dry.  Neurological:     Mental Status: She is alert and oriented to person, place, and time.  Psychiatric:        Mood and Affect: Mood normal.        Behavior: Behavior normal.    BP 110/68 (BP Location: Left Arm, Patient Position: Sitting, Cuff Size: Normal)   Pulse 87   Temp (!) 97.1 F (36.2  C) (Temporal)   Ht 5\' 7"  (1.702 m)   Wt 180 lb 6.4 oz (81.8 kg)   SpO2 97%   BMI 28.25 kg/m  Wt Readings from Last 3 Encounters:  06/06/21 180 lb 6.4 oz (81.8 kg)  05/09/21 183 lb (83 kg)  03/27/21 187 lb (84.8 kg)     Health Maintenance Due  Topic Date Due  . Pneumococcal Vaccine 72-67 Years old (1 - PCV) Never done  . Hepatitis  C Screening  Never done    There are no preventive care reminders to display for this patient.  Lab Results  Component Value Date   TSH 1.42 05/09/2021   Lab Results  Component Value Date   WBC 4.7 12/20/2020   HGB 14.8 12/20/2020   HCT 44.5 12/20/2020   MCV 83.8 12/20/2020   PLT 191.0 12/20/2020   Lab Results  Component Value Date   NA 139 12/20/2020   K 4.0 12/20/2020   CO2 30 12/20/2020   GLUCOSE 88 12/20/2020   BUN 16 12/20/2020   CREATININE 0.99 12/20/2020   BILITOT 0.5 12/20/2020   ALKPHOS 102 12/20/2020   AST 22 12/20/2020   ALT 32 12/20/2020   PROT 7.0 12/20/2020   ALBUMIN 4.3 12/20/2020   CALCIUM 9.4 12/20/2020   GFR 69.87 12/20/2020   Lab Results  Component Value Date   CHOL 337 (H) 03/27/2021   Lab Results  Component Value Date   HDL 57.00 03/27/2021   Lab Results  Component Value Date   LDLCALC 265 (H) 03/27/2021   Lab Results  Component Value Date   TRIG 75.0 03/27/2021   Lab Results  Component Value Date   CHOLHDL 6 03/27/2021   Lab Results  Component Value Date   HGBA1C 5.8 03/27/2021      Assessment & Plan:   Problem List Items Addressed This Visit       Respiratory   Congestion of upper airway - Primary   Relevant Medications   mometasone (NASONEX) 50 MCG/ACT nasal spray     Musculoskeletal and Integument   Thoracic myofascial strain     Other   Elevated LDL cholesterol level   Post-nasal drip   Relevant Medications   mometasone (NASONEX) 50 MCG/ACT nasal spray    Meds ordered this encounter  Medications  . mometasone (NASONEX) 50 MCG/ACT nasal spray    Sig: Place 2 sprays  into the nose daily.    Dispense:  1 each    Refill:  3    Follow-up: Return Follow-up for scheduled visit for cholesterol check.Libby Maw, MD

## 2021-06-07 NOTE — Telephone Encounter (Signed)
Spoke to pt 10/20 and advised she would not receive no show fee

## 2021-06-28 ENCOUNTER — Encounter: Payer: Self-pay | Admitting: Family Medicine

## 2021-06-28 ENCOUNTER — Ambulatory Visit (INDEPENDENT_AMBULATORY_CARE_PROVIDER_SITE_OTHER): Payer: Medicaid Other | Admitting: Family Medicine

## 2021-06-28 ENCOUNTER — Other Ambulatory Visit: Payer: Self-pay

## 2021-06-28 VITALS — BP 122/78 | HR 77 | Temp 97.3°F | Ht 67.0 in | Wt 182.6 lb

## 2021-06-28 DIAGNOSIS — E7800 Pure hypercholesterolemia, unspecified: Secondary | ICD-10-CM | POA: Diagnosis not present

## 2021-06-28 DIAGNOSIS — G43009 Migraine without aura, not intractable, without status migrainosus: Secondary | ICD-10-CM

## 2021-06-28 DIAGNOSIS — E559 Vitamin D deficiency, unspecified: Secondary | ICD-10-CM

## 2021-06-28 HISTORY — DX: Migraine without aura, not intractable, without status migrainosus: G43.009

## 2021-06-28 HISTORY — DX: Vitamin D deficiency, unspecified: E55.9

## 2021-06-28 MED ORDER — SUMATRIPTAN SUCCINATE 50 MG PO TABS: 50.0000 mg | ORAL_TABLET | ORAL | 0 refills | Status: DC | PRN

## 2021-06-28 NOTE — Progress Notes (Addendum)
Established Patient Office Visit  Subjective:  Patient ID: Cathy Hamilton, female    DOB: 1975/11/27  Age: 45 y.o. MRN: [id number]  CC:  Chief Complaint  Patient presents with   Follow-up    Follow up on labs patient fasting for labs.     HPI Cathy Hamilton presents for recheck of LDL cholesterol.  She is fasting.  Having no issues taking atorvastatin.  She is taking 3000 international units of vitamin D daily.  Her baby boy is growing up way too fast.  He is learning to crawl.  Past Medical History:  Diagnosis Date   Allergy    GERD (gastroesophageal reflux disease)    with pregnancy-no meds   Hyperlipidemia     Past Surgical History:  Procedure Laterality Date   CESAREAN SECTION  2009, 2011   CESAREAN SECTION  03/20/2012   Procedure: CESAREAN SECTION;  Surgeon: Darlyn Chamber, MD;  Location: [facility];  Service: Gynecology;  Laterality: N/A;   CESAREAN SECTION N/A 10/25/2020   Procedure: REPEAT CESAREAN SECTION PREVIOUS X 3  EDC: 10-30-20 ALLERGIES: PENICILLINS;  Surgeon: Dian Queen, MD;  Location: [facility];  Service: Obstetrics;  Laterality: N/A;    Family History  Problem Relation Age of Onset   Hyperlipidemia Mother    Diabetes Mother    Arthritis Mother    Cataracts Mother    Alcohol abuse Father    Stroke Father    Emphysema Father    Hyperlipidemia Sister    Anemia Sister    Cancer Maternal Grandfather    Hyperlipidemia Sister    Anemia Sister     Social History   Socioeconomic History   Marital status: Married    Spouse name: Not on file   Number of children: Not on file   Years of education: Not on file   Highest education level: Not on file  Occupational History   Not on file  Tobacco Use   Smoking status: Never   Smokeless tobacco: Never  Vaping Use   Vaping Use: Never used  Substance and Sexual Activity   Alcohol use: No   Drug use: No   Sexual activity: Yes    Birth control/protection: Pill  Other Topics Concern   Not on  file  Social History Narrative   Not on file   Social Determinants of Health   Financial Resource Strain: Not on file  Food Insecurity: Not on file  Transportation Needs: Not on file  Physical Activity: Not on file  Stress: Not on file  Social Connections: Not on file  Intimate Partner Violence: Not on file    Outpatient Medications Prior to Visit  Medication Sig Dispense Refill   cholecalciferol (VITAMIN D3) 25 MCG (1000 UNIT) tablet Take 2,000 Units by mouth daily.     ibuprofen (ADVIL) 800 MG tablet Take 1 tablet (800 mg total) by mouth every 8 (eight) hours. 30 tablet 0   mometasone (NASONEX) 50 MCG/ACT nasal spray Place 2 sprays into the nose daily. 1 each 3   norethindrone (MICRONOR) 0.35 MG tablet      Prenatal Vit-Fe Fumarate-FA (PRENATAL MULTIVITAMIN) TABS tablet Take 1 tablet by mouth daily.     atorvastatin (LIPITOR) 20 MG tablet Take 1 tablet (20 mg total) by mouth daily. 90 tablet 1   SUMAtriptan (IMITREX) 50 MG tablet Take 1 tablet (50 mg total) by mouth every 2 (two) hours as needed for migraine. May repeat in 2 hours if headache persists or recurs.  No more than 2 each day. 10 tablet 0   methocarbamol (ROBAXIN) 500 MG tablet Take 1 tablet (500 mg total) by mouth every 8 (eight) hours as needed for muscle spasms. (Patient not taking: Reported on 06/28/2021) 40 tablet 0   No facility-administered medications prior to visit.    Allergies  Allergen Reactions   Other Itching    "Tumeric causes my lips to itch and break out".    Penicillins Rash and Other (See Comments)    Childhood reaction    ROS Review of Systems  Constitutional:  Negative for diaphoresis, fatigue, fever and unexpected weight change.  Respiratory: Negative.    Cardiovascular: Negative.   Gastrointestinal: Negative.   Musculoskeletal:  Negative for arthralgias and myalgias.  Psychiatric/Behavioral: Negative.       Objective:    Physical Exam Vitals and nursing note reviewed.   Constitutional:      Appearance: Normal appearance.  HENT:     Head: Normocephalic and atraumatic.  Eyes:     General: No scleral icterus.       Right eye: No discharge.        Left eye: No discharge.     Extraocular Movements: Extraocular movements intact.     Conjunctiva/sclera: Conjunctivae normal.  Pulmonary:     Effort: Pulmonary effort is normal.  Neurological:     Mental Status: She is alert and oriented to person, place, and time.  Psychiatric:        Mood and Affect: Mood normal.        Behavior: Behavior normal.    BP 122/78 (BP Location: Left Arm, Patient Position: Sitting, Cuff Size: Normal)   Pulse 77   Temp (!) 97.3 F (36.3 C) (Temporal)   Ht 5\' 7"  (1.702 m)   Wt 182 lb 9.6 oz (82.8 kg)   SpO2 97%   BMI 28.60 kg/m  Wt Readings from Last 3 Encounters:  06/28/21 182 lb 9.6 oz (82.8 kg)  06/06/21 180 lb 6.4 oz (81.8 kg)  05/09/21 183 lb (83 kg)     Health Maintenance Due  Topic Date Due   Pneumococcal Vaccine 32-38 Years old (1 - PCV) Never done   Hepatitis C Screening  Never done    There are no preventive care reminders to display for this patient.  Lab Results  Component Value Date   TSH 1.42 05/09/2021   Lab Results  Component Value Date   WBC 4.7 12/20/2020   HGB 14.8 12/20/2020   HCT 44.5 12/20/2020   MCV 83.8 12/20/2020   PLT 191.0 12/20/2020   Lab Results  Component Value Date   NA 140 06/28/2021   K 4.0 06/28/2021   CO2 29 06/28/2021   GLUCOSE 73 06/28/2021   BUN 8 06/28/2021   CREATININE 0.85 06/28/2021   BILITOT 0.8 06/28/2021   ALKPHOS 69 06/28/2021   AST 18 06/28/2021   ALT 16 06/28/2021   PROT 6.7 06/28/2021   ALBUMIN 4.5 06/28/2021   CALCIUM 9.4 06/28/2021   GFR 83.59 06/28/2021   Lab Results  Component Value Date   CHOL 187 06/28/2021   Lab Results  Component Value Date   HDL 47.30 06/28/2021   Lab Results  Component Value Date   LDLCALC 125 (H) 06/28/2021   Lab Results  Component Value Date   TRIG  74.0 06/28/2021   Lab Results  Component Value Date   CHOLHDL 4 06/28/2021   Lab Results  Component Value Date   HGBA1C 5.8 03/27/2021  Assessment & Plan:   Problem List Items Addressed This Visit       Cardiovascular and Mediastinum   Migraine without aura and without status migrainosus, not intractable   Relevant Medications   SUMAtriptan (IMITREX) 50 MG tablet   atorvastatin (LIPITOR) 40 MG tablet     Other   Elevated LDL cholesterol level - Primary   Relevant Medications   atorvastatin (LIPITOR) 40 MG tablet   Other Relevant Orders   Comprehensive metabolic panel (Completed)   Lipid panel (Completed)   Vitamin D deficiency   Relevant Orders   VITAMIN D 25 Hydroxy (Vit-D Deficiency, Fractures) (Completed)    Meds ordered this encounter  Medications   SUMAtriptan (IMITREX) 50 MG tablet    Sig: Take 1 tablet (50 mg total) by mouth every 2 (two) hours as needed for migraine. May repeat in 2 hours if headache persists or recurs. No more than 2 each day.    Dispense:  10 tablet    Refill:  0   atorvastatin (LIPITOR) 40 MG tablet    Sig: Take 1 tablet (40 mg total) by mouth daily.    Dispense:  90 tablet    Refill:  3    Follow-up: Return in about 6 months (around 12/26/2021).    Libby Maw, MD

## 2021-06-29 LAB — COMPREHENSIVE METABOLIC PANEL
ALT: 16 U/L (ref 0–35)
AST: 18 U/L (ref 0–37)
Albumin: 4.5 g/dL (ref 3.5–5.2)
Alkaline Phosphatase: 69 U/L (ref 39–117)
BUN: 8 mg/dL (ref 6–23)
CO2: 29 mEq/L (ref 19–32)
Calcium: 9.4 mg/dL (ref 8.4–10.5)
Chloride: 103 mEq/L (ref 96–112)
Creatinine, Ser: 0.85 mg/dL (ref 0.40–1.20)
GFR: 83.59 mL/min (ref >60.00)
Glucose, Bld: 73 mg/dL (ref 70–99)
Potassium: 4 mEq/L (ref 3.5–5.1)
Sodium: 140 mEq/L (ref 135–145)
Total Bilirubin: 0.8 mg/dL (ref 0.2–1.2)
Total Protein: 6.7 g/dL (ref 6.0–8.3)

## 2021-06-29 LAB — LIPID PANEL
Cholesterol: 187 mg/dL (ref 0–200)
HDL: 47.3 mg/dL (ref >39.00)
LDL Cholesterol: 125 mg/dL — ABNORMAL HIGH (ref 0–99)
NonHDL: 139.54
Total CHOL/HDL Ratio: 4
Triglycerides: 74 mg/dL (ref 0.0–149.0)
VLDL: 14.8 mg/dL (ref 0.0–40.0)

## 2021-06-29 LAB — VITAMIN D 25 HYDROXY (VIT D DEFICIENCY, FRACTURES): VITD: 31.61 ng/mL (ref 30.00–100.00)

## 2021-07-02 MED ORDER — ATORVASTATIN CALCIUM 40 MG PO TABS: 40.0000 mg | ORAL_TABLET | Freq: Every day | ORAL | 3 refills | Status: DC

## 2021-07-02 NOTE — Addendum Note (Signed)
Addended by: Jon Billings on: 07/02/2021 08:07 AM   Modules accepted: Orders

## 2021-07-12 DIAGNOSIS — H5213 Myopia, bilateral: Secondary | ICD-10-CM | POA: Diagnosis not present

## 2021-07-31 DIAGNOSIS — H5213 Myopia, bilateral: Secondary | ICD-10-CM | POA: Diagnosis not present

## 2021-07-31 DIAGNOSIS — H52223 Regular astigmatism, bilateral: Secondary | ICD-10-CM | POA: Diagnosis not present

## 2021-09-10 ENCOUNTER — Encounter: Payer: Self-pay | Admitting: Family Medicine

## 2021-09-10 ENCOUNTER — Telehealth (INDEPENDENT_AMBULATORY_CARE_PROVIDER_SITE_OTHER): Payer: Medicaid Other | Admitting: Family Medicine

## 2021-09-10 VITALS — Ht 67.0 in | Wt 180.0 lb

## 2021-09-10 DIAGNOSIS — E7800 Pure hypercholesterolemia, unspecified: Secondary | ICD-10-CM | POA: Diagnosis not present

## 2021-09-10 NOTE — Progress Notes (Signed)
Established Patient Office Visit  Subjective:  Patient ID: Cathy Hamilton, female    DOB: July 05, 1976  Age: 46 y.o. 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     HPI Cathy Hamilton presents for follow-up of longstanding history of elevated LDL cholesterol.  It has responded well to atorvastatin.  Prior to 20 mg of atorvastatin LDL was 265 and then fell to 125.  Atorvastatin was increased to 40 mg.  Patient has heard that coenzyme Q 10 can lower the cholesterol as well and treatment.   Past Medical History:  Diagnosis Date   Allergy    GERD (gastroesophageal reflux disease)    with pregnancy-no meds   Hyperlipidemia     Past Surgical History:  Procedure Laterality Date   CESAREAN SECTION  2009, 2011   CESAREAN SECTION  03/20/2012   Procedure: CESAREAN SECTION;  Surgeon: Darlyn Chamber, MD;  Location: [facility];  Service: Gynecology;  Laterality: N/A;   CESAREAN SECTION N/A 10/25/2020   Procedure: REPEAT CESAREAN SECTION PREVIOUS X 3  EDC: 10-30-20 ALLERGIES: PENICILLINS;  Surgeon: Dian Queen, MD;  Location: [facility];  Service: Obstetrics;  Laterality: N/A;    Family History  Problem Relation Age of Onset   Hyperlipidemia Mother    Diabetes Mother    Arthritis Mother    Cataracts Mother    Alcohol abuse Father    Stroke Father    Emphysema Father    Hyperlipidemia Sister    Anemia Sister    Cancer Maternal Grandfather    Hyperlipidemia Sister    Anemia Sister     Social History   Socioeconomic History   Marital status: Married    Spouse name: Not on file   Number of children: Not on file   Years of education: Not on file   Highest education level: Not on file  Occupational History   Not on file  Tobacco Use   Smoking status: Never   Smokeless tobacco: Never   Vaping Use   Vaping Use: Never used  Substance and Sexual Activity   Alcohol use: No   Drug use: No   Sexual activity: Yes    Birth control/protection: Pill  Other Topics Concern   Not on file  Social History Narrative   Not on file   Social Determinants of Health   Financial Resource Strain: Not on file  Food Insecurity: Not on file  Transportation Needs: Not on file  Physical Activity: Not on file  Stress: Not on file  Social Connections: Not on file  Intimate Partner Violence: Not on file    Outpatient Medications Prior to Visit  Medication Sig Dispense Refill   atorvastatin (LIPITOR) 40 MG tablet Take 1 tablet (40 mg total) by mouth daily. 90 tablet 3   cholecalciferol (VITAMIN D3) 25 MCG (1000 UNIT) tablet Take 2,000 Units by mouth daily.     ibuprofen (ADVIL) 800 MG tablet Take 1 tablet (800 mg total) by mouth every 8 (eight) hours. 30 tablet 0   methocarbamol (ROBAXIN) 500 MG tablet Take 1 tablet (500 mg total) by mouth every 8 (eight) hours as needed for muscle spasms. 40 tablet 0   Prenatal Vit-Fe Fumarate-FA (PRENATAL MULTIVITAMIN) TABS tablet Take 1 tablet by mouth daily.     SUMAtriptan (IMITREX)  50 MG tablet Take 1 tablet (50 mg total) by mouth every 2 (two) hours as needed for migraine. May repeat in 2 hours if headache persists or recurs. No more than 2 each day. 10 tablet 0   norethindrone (MICRONOR) 0.35 MG tablet  (Patient not taking: Reported on 09/10/2021)     mometasone (NASONEX) 50 MCG/ACT nasal spray Place 2 sprays into the nose daily. 1 each 3   No facility-administered medications prior to visit.    Allergies  Allergen Reactions   Other Itching    "Tumeric causes my lips to itch and break out".    Penicillins Rash and Other (See Comments)    Childhood reaction    ROS Review of Systems  Constitutional:  Negative for diaphoresis, fatigue, fever and unexpected weight change.  HENT: Negative.    Respiratory: Negative.    Cardiovascular:  Negative.   Gastrointestinal: Negative.   Genitourinary: Negative.   Musculoskeletal:  Negative for gait problem and joint swelling.  Psychiatric/Behavioral: Negative.       Objective:    Physical Exam Vitals and nursing note reviewed.  Constitutional:      General: She is not in acute distress.    Appearance: Normal appearance. She is not ill-appearing or toxic-appearing.  HENT:     Head: Normocephalic and atraumatic.     Right Ear: External ear normal.     Left Ear: External ear normal.  Eyes:     General: No scleral icterus.       Right eye: No discharge.        Left eye: No discharge.     Extraocular Movements: Extraocular movements intact.     Conjunctiva/sclera: Conjunctivae normal.  Pulmonary:     Effort: Pulmonary effort is normal.  Neurological:     Mental Status: She is alert and oriented to person, place, and time.  Psychiatric:        Mood and Affect: Mood normal.        Behavior: Behavior normal.    Ht 5\' 7"  (1.702 m)    Wt 180 lb (81.6 kg)    BMI 28.19 kg/m  Wt Readings from Last 3 Encounters:  09/10/21 180 lb (81.6 kg)  06/28/21 182 lb 9.6 oz (82.8 kg)  06/06/21 180 lb 6.4 oz (81.8 kg)     Health Maintenance Due  Topic Date Due   Hepatitis C Screening  Never done    There are no preventive care reminders to display for this patient.  Lab Results  Component Value Date   TSH 1.42 05/09/2021   Lab Results  Component Value Date   WBC 4.7 12/20/2020   HGB 14.8 12/20/2020   HCT 44.5 12/20/2020   MCV 83.8 12/20/2020   PLT 191.0 12/20/2020   Lab Results  Component Value Date   NA 140 06/28/2021   K 4.0 06/28/2021   CO2 29 06/28/2021   GLUCOSE 73 06/28/2021   BUN 8 06/28/2021   CREATININE 0.85 06/28/2021   BILITOT 0.8 06/28/2021   ALKPHOS 69 06/28/2021   AST 18 06/28/2021   ALT 16 06/28/2021   PROT 6.7 06/28/2021   ALBUMIN 4.5 06/28/2021   CALCIUM 9.4 06/28/2021   GFR 83.59 06/28/2021   Lab Results  Component Value Date   CHOL 187  06/28/2021   Lab Results  Component Value Date   HDL 47.30 06/28/2021   Lab Results  Component Value Date   LDLCALC 125 (H) 06/28/2021   Lab Results  Component Value Date  TRIG 74.0 06/28/2021   Lab Results  Component Value Date   CHOLHDL 4 06/28/2021   Lab Results  Component Value Date   HGBA1C 5.8 03/27/2021      Assessment & Plan:   Problem List Items Addressed This Visit       Other   Elevated LDL cholesterol level - Primary   Relevant Orders   Ambulatory referral to Cardiology    No orders of the defined types were placed in this encounter.   Follow-up: Return Has follow-up scheduled in May..  Patient continues to have misgivings about statins.  She would like to replace the atorvastatin with coenzyme every 10 to see what it will do for her LDL cholesterol.  Advised her not to do this.  Advised her to continue the 40 mg pill.  Offered referral to cardiology for second opinion.  Libby Maw, MD

## 2021-10-18 DIAGNOSIS — K219 Gastro-esophageal reflux disease without esophagitis: Secondary | ICD-10-CM | POA: Insufficient documentation

## 2021-10-18 DIAGNOSIS — T7840XA Allergy, unspecified, initial encounter: Secondary | ICD-10-CM | POA: Insufficient documentation

## 2021-10-18 DIAGNOSIS — E785 Hyperlipidemia, unspecified: Secondary | ICD-10-CM | POA: Insufficient documentation

## 2021-10-22 ENCOUNTER — Other Ambulatory Visit: Payer: Self-pay

## 2021-10-22 ENCOUNTER — Encounter: Payer: Self-pay | Admitting: Cardiology

## 2021-10-22 ENCOUNTER — Ambulatory Visit (INDEPENDENT_AMBULATORY_CARE_PROVIDER_SITE_OTHER): Payer: Medicaid Other | Admitting: Cardiology

## 2021-10-22 VITALS — BP 116/66 | HR 87 | Ht 66.6 in | Wt 172.1 lb

## 2021-10-22 DIAGNOSIS — E782 Mixed hyperlipidemia: Secondary | ICD-10-CM

## 2021-10-22 DIAGNOSIS — R011 Cardiac murmur, unspecified: Secondary | ICD-10-CM | POA: Diagnosis not present

## 2021-10-22 NOTE — Patient Instructions (Addendum)
Medication Instructions:  Your physician recommends that you continue on your current medications as directed. Please refer to the Current Medication list given to you today.  *If you need a refill on your cardiac medications before your next appointment, please call your pharmacy*   Lab Work: None ordered If you have labs (blood work) drawn today and your tests are completely normal, you will receive your results only by: [place] (if you have MyChart) OR A paper copy in the mail If you have any lab test that is abnormal or we need to change your treatment, we will call you to review the results.   Testing/Procedures: Your physician has requested that you have an echocardiogram. Echocardiography is a painless test that uses sound waves to create images of your heart. It provides your doctor with information about the size and shape of your heart and how well your hearts chambers and valves are working. This procedure takes approximately one hour. There are no restrictions for this procedure.  We will order CT coronary calcium score. It will cost $99.00 and is not covered by insurance.  Please call [phone] to schedule.   [facility]  [address], [place] [address]     Follow-Up: At [facility], you and your health needs are our priority.  As part of our continuing mission to provide you with exceptional heart care, we have created designated Provider Care Teams.  These Care Teams include your primary Cardiologist (physician) and Advanced Practice Providers (APPs -  Physician Assistants and Nurse Practitioners) who all work together to provide you with the care you need, when you need it.  We recommend signing up for the patient portal called "MyChart".  Sign up information is provided on this After Visit Summary.  MyChart is used to connect with patients for Virtual Visits (Telemedicine).  Patients are able to view lab/test results, encounter  notes, upcoming appointments, etc.  Non-urgent messages can be sent to your provider as well.   To learn more about what you can do with MyChart, go to [email or web address].    Your next appointment:   9 month(s)  The format for your next appointment:   In Person  Provider:   Jyl Heinz, MD   Other Instructions Echocardiogram An echocardiogram is a test that uses sound waves (ultrasound) to produce images of the heart. Images from an echocardiogram can provide important information about: Heart size and shape. The size and thickness and movement of your heart's walls. Heart muscle function and strength. Heart valve function or if you have stenosis. Stenosis is when the heart valves are too narrow. If blood is flowing backward through the heart valves (regurgitation). A tumor or infectious growth around the heart valves. Areas of heart muscle that are not working well because of poor blood flow or injury from a heart attack. Aneurysm detection. An aneurysm is a weak or damaged part of an artery wall. The wall bulges out from the normal force of blood pumping through the body. Tell a health care provider about: Any allergies you have. All medicines you are taking, including vitamins, herbs, eye drops, creams, and over-the-counter medicines. Any blood disorders you have. Any surgeries you have had. Any medical conditions you have. Whether you are pregnant or may be pregnant. What are the risks? Generally, this is a safe test. However, problems may occur, including an allergic reaction to dye (contrast) that may be used during the test. What happens before the  test? No specific preparation is needed. You may eat and drink normally. What happens during the test?  You will take off your clothes from the waist up and put on a hospital gown. Electrodes or electrocardiogram (ECG)patches may be placed on your chest. The electrodes or patches are then connected to a device that  monitors your heart rate and rhythm. You will lie down on a table for an ultrasound exam. A gel will be applied to your chest to help sound waves pass through your skin. A handheld device, called a transducer, will be pressed against your chest and moved over your heart. The transducer produces sound waves that travel to your heart and bounce back (or "echo" back) to the transducer. These sound waves will be captured in real-time and changed into images of your heart that can be viewed on a video monitor. The images will be recorded on a computer and reviewed by your health care provider. You may be asked to change positions or hold your breath for a short time. This makes it easier to get different views or better views of your heart. In some cases, you may receive contrast through an IV in one of your veins. This can improve the quality of the pictures from your heart. The procedure may vary among health care providers and hospitals. What can I expect after the test? You may return to your normal, everyday life, including diet, activities, and medicines, unless your health care provider tells you not to do that. Follow these instructions at home: It is up to you to get the results of your test. Ask your health care provider, or the department that is doing the test, when your results will be ready. Keep all follow-up visits. This is important. Summary An echocardiogram is a test that uses sound waves (ultrasound) to produce images of the heart. Images from an echocardiogram can provide important information about the size and shape of your heart, heart muscle function, heart valve function, and other possible heart problems. You do not need to do anything to prepare before this test. You may eat and drink normally. After the echocardiogram is completed, you may return to your normal, everyday life, unless your health care provider tells you not to do that. This information is not intended to  replace advice given to you by your health care provider. Make sure you discuss any questions you have with your health care provider. Document Revised: 04/11/2021 Document Reviewed: 03/21/2020 Elsevier Patient Education  2022 [place].   Coronary Calcium Scan A coronary calcium scan is an imaging test used to look for deposits of plaque in the inner lining of the blood vessels of the heart (coronary arteries). Plaque is made up of calcium, protein, and fatty substances. These deposits of plaque can partly clog and narrow the coronary arteries without producing any symptoms or warning signs. This puts a person at risk for a heart attack. This test is recommended for people who are at moderate risk for heart disease. The test can find plaque deposits before symptoms develop. Tell a health care provider about: Any allergies you have. All medicines you are taking, including vitamins, herbs, eye drops, creams, and over-the-counter medicines. Any problems you or family members have had with anesthetic medicines. Any blood disorders you have. Any surgeries you have had. Any medical conditions you have. Whether you are pregnant or may be pregnant. What are the risks? Generally, this is a safe procedure. However, problems may occur, including: Harm  to a pregnant woman and her unborn baby. This test involves the use of radiation. Radiation exposure can be dangerous to a pregnant woman and her unborn baby. If you are pregnant or think you may be pregnant, you should not have this procedure done. Slight increase in the risk of cancer. This is because of the radiation involved in the test. What happens before the procedure? Ask your health care provider for any specific instructions on how to prepare for this procedure. You may be asked to avoid products that contain caffeine, tobacco, or nicotine for 4 hours before the procedure. What happens during the procedure?  You will undress and remove any  jewelry from your neck or chest. You will put on a hospital gown. Sticky electrodes will be placed on your chest. The electrodes will be connected to an electrocardiogram (ECG) machine to record a tracing of the electrical activity of your heart. You will lie down on a curved bed that is attached to the [place]. You may be given medicine to slow down your heart rate so that clear pictures can be created. You will be moved into the CT scanner, and the CT scanner will take pictures of your heart. During this time, you will be asked to lie still and hold your breath for 2-3 seconds at a time while each picture of your heart is being taken. The procedure may vary among health care providers and hospitals. What happens after the procedure? You can get dressed. You can return to your normal activities. It is up to you to get the results of your procedure. Ask your health care provider, or the department that is doing the procedure, when your results will be ready. Summary A coronary calcium scan is an imaging test used to look for deposits of plaque in the inner lining of the blood vessels of the heart (coronary arteries). Plaque is made up of calcium, protein, and fatty substances. Generally, this is a safe procedure. Tell your health care provider if you are pregnant or may be pregnant. Ask your health care provider for any specific instructions on how to prepare for this procedure. A CT scanner will take pictures of your heart. You can return to your normal activities after the scan is done. This information is not intended to replace advice given to you by your health care provider. Make sure you discuss any questions you have with your health care provider. Document Revised: 02/11/2019 Document Reviewed: 02/16/2019 Elsevier Patient Education  2022 [place] physician has requested that you have an echocardiogram. Echocardiography is a painless test that uses sound waves to create  images of your heart. It provides your doctor with information about the size and shape of your heart and how well your hearts chambers and valves are working. This procedure takes approximately one hour. There are no restrictions for this procedure.

## 2021-10-22 NOTE — Progress Notes (Signed)
?Cardiology Office Note:   ? ?Date:  10/22/2021  ? ?ID:  Cathy Hamilton, DOB [birth year], MRN [id number] ? ?PCP:  Libby Maw, MD  ?Cardiologist:  Jenean Lindau, MD  ? ?Referring MD: Libby Maw,*  ? ? ?ASSESSMENT:   ? ?1. Mixed hyperlipidemia   ?2. Cardiac murmur   ? ?PLAN:   ? ?In order of problems listed above: ? ?Primary prevention stressed with the patient.  Importance of compliance with diet medication stressed and she vocalized understanding.  She was advised to walk at least half an hour a day 5 days a week and she promises to do so. ?Mixed dyslipidemia: Diet was emphasized.  Lifestyle modification urged.  She has significantly elevated LDL.  She had multiple questions about lipid-lowering.  I answered them to her satisfaction.  I will do a calcium score and she is agreeable.  This will help [place] assess restratification in a more objective manner. ?Cardiac murmur: Echocardiogram will be done to assess murmur heard on auscultation. ?Patient will be seen in follow-up appointment in 9 months or earlier if the patient has any concerns ? ? ? ?Medication Adjustments/Labs and Tests Ordered: ?Current medicines are reviewed at length with the patient today.  Concerns regarding medicines are outlined above.  ?No orders of the defined types were placed in this encounter. ? ?No orders of the defined types were placed in this encounter. ? ? ? ?History of Present Illness:   ? ?Cathy Hamilton is a 46 y.o. female who is being seen today for the evaluation of mixed dyslipidemia at the request of Libby Maw,*.  Patient is a pleasant 46 year old female.  She has past medical history of mixed dyslipidemia.  She denies any problems at this time and takes care of activities of daily living.  No chest pain orthopnea or PND.  At the time of my evaluation, the patient is alert awake oriented and in no distress.  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 ? ?Past Medical History:  ?Diagnosis Date  ? Acute headache 01/10/2017  ? Allergy   ? Cervical radiculopathy 08/16/2019  ? Chest wall pain 09/11/2018  ? Chronic abdominal pain 01/10/2017  ? Congestion of upper airway 06/06/2021  ? Ear congestion 12/27/2019  ? Elevated cholesterol 10/03/2012  ? Formatting of this note might be different from the original. Overview:  10 year risk for heart disease less than 3%  ? Elevated LDL cholesterol level 10/03/2012  ? 10 year risk for heart disease less than 3%  ? GERD (gastroesophageal reflux disease)   ? with pregnancy-no meds  ? Healthcare maintenance 11/23/2019  ? 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 Hyperlipidemia   ? Jaw pain 04/01/2019  ? Lumbar radiculopathy 08/16/2019  ? Migraine without aura and without status migrainosus, not intractable 06/28/2021  ? Muscle fasciculation 11/23/2019  ? Neck pain 04/01/2019  ? Formatting of this note might be different from the original. USG right neck negative.  Last Assessment & Plan:  Formatting of this note might be different from the original. Concern about her neck. Chronic history of intermittent discomfort in the primarily right side of her neck.  She has been doing some physical therapy herself along with wearing a night brace for TMJ and does feel symptoms are  ? 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 Post-nasal drip 06/06/2021  ? Previous cesarean delivery affecting pregnancy 03/21/2012  ?  Previous cesarean section 10/25/2020  ? S/P cesarean section 03/21/2012  ? Strain of calf muscle 12/13/2020  ? Submandibular gland swelling 01/23/2018  ? Thoracic myofascial strain 12/20/2020  ? Vitamin D deficiency 06/28/2021  ? ? ?Past Surgical History:  ?Procedure Laterality Date  ? CESAREAN SECTION  2009, 2011  ? CESAREAN SECTION  03/20/2012  ? Procedure: CESAREAN SECTION;  Surgeon: Darlyn Chamber, MD;  Location: [facility];  Service: Gynecology;  Laterality: N/A;  ? CESAREAN SECTION N/A 10/25/2020  ? Procedure: REPEAT CESAREAN SECTION  PREVIOUS X 3  EDC: 10-30-20 ALLERGIES: PENICILLINS;  Surgeon: Dian Queen, MD;  Location: [facility];  Service: Obstetrics;  Laterality: N/A;  ? ? ?Current Medications: ?Current Meds  ?Medication Sig  ? atorvastatin (LIPITOR) 40 MG tablet Take 1 tablet (40 mg total) by mouth daily.  ? cholecalciferol (VITAMIN D3) 25 MCG (1000 UNIT) tablet Take 2,000 Units by mouth daily.  ? ibuprofen (ADVIL) 800 MG tablet Take 1 tablet (800 mg total) by mouth every 8 (eight) hours.  ? Prenatal Vit-Fe Fumarate-FA (PRENATAL MULTIVITAMIN) TABS tablet Take 1 tablet by mouth daily.  ? SUMAtriptan (IMITREX) 50 MG tablet Take 1 tablet (50 mg total) by mouth every 2 (two) hours as needed for migraine. May repeat in 2 hours if headache persists or recurs. No more than 2 each day.  ?  ? ?Allergies:   Other and Penicillins  ? ?Social History  ? ?Socioeconomic History  ? Marital status: Married  ?  Spouse name: Not on file  ? Number of children: Not on file  ? Years of education: Not on file  ? Highest education level: Not on file  ?Occupational History  ? Not on file  ?Tobacco Use  ? Smoking status: Never  ? Smokeless tobacco: Never  ?Vaping Use  ? Vaping Use: Never used  ?Substance and Sexual Activity  ? Alcohol use: No  ? Drug use: No  ? Sexual activity: Yes  ?  Birth control/protection: Pill  ?Other Topics Concern  ? Not on file  ?Social History Narrative  ? Not on file  ? ?Social Determinants of Health  ? ?Financial Resource Strain: Not on file  ?Food Insecurity: Not on file  ?Transportation Needs: Not on file  ?Physical Activity: Not on file  ?Stress: Not on file  ?Social Connections: Not on file  ?  ? ?Family History: ?The patient's family history includes Alcohol abuse in her father; Anemia in her sister and sister; Arthritis in her mother; Cancer in her maternal grandfather; Cataracts in her mother; Diabetes in her mother; Emphysema in her father; Hyperlipidemia in her mother, sister, and sister; Stroke in her father. ? ?ROS:    ?Please see the history of present illness.    ?All other systems reviewed and are negative. ? ?EKGs/Labs/Other Studies Reviewed:   ? ?The following studies were reviewed today: ?EKG reveals sinus rhythm and nonspecific ST-T changes ? ? ?Recent Labs: ?12/20/2020: Hemoglobin 14.8; Platelets 191.0 ?05/09/2021: TSH 1.42 ?06/28/2021: ALT 16; BUN 8; Creatinine, Ser 0.85; Potassium 4.0; Sodium 140  ?Recent Lipid Panel ?   ?Component Value Date/Time  ? CHOL 187 06/28/2021 1632  ? TRIG 74.0 06/28/2021 1632  ? HDL 47.30 06/28/2021 1632  ? CHOLHDL 4 06/28/2021 1632  ? VLDL 14.8 06/28/2021 1632  ? LDLCALC 125 (H) 06/28/2021 1632  ? LDLDIRECT 248.0 03/27/2021 1050  ? ? ?Physical Exam:   ? ?VS:  BP 116/66   Pulse 87   Ht 5' 6.6" (1.692 m)  Wt 172 lb 1.3 oz (78.1 kg)   SpO2 97%   BMI 27.28 kg/m?    ? ?Wt Readings from Last 3 Encounters:  ?10/22/21 172 lb 1.3 oz (78.1 kg)  ?09/10/21 180 lb (81.6 kg)  ?06/28/21 182 lb 9.6 oz (82.8 kg)  ?  ? ?GEN: Patient is in no acute distress ?HEENT: Normal ?NECK: No JVD; No carotid bruits ?LYMPHATICS: No lymphadenopathy ?CARDIAC: S1 S2 regular, 2/6 systolic murmur at the apex. ?RESPIRATORY:  Clear to auscultation without rales, wheezing or rhonchi  ?ABDOMEN: Soft, non-tender, non-distended ?MUSCULOSKELETAL:  No edema; No deformity  ?SKIN: Warm and dry ?NEUROLOGIC:  Alert and oriented x 3 ?PSYCHIATRIC:  Normal affect  ? ? ?Signed, ?Jenean Lindau, MD  ?10/22/2021 3:30 PM    ?[facility]   ?

## 2021-11-07 ENCOUNTER — Ambulatory Visit (HOSPITAL_BASED_OUTPATIENT_CLINIC_OR_DEPARTMENT_OTHER)
Admission: RE | Admit: 2021-11-07 | Discharge: 2021-11-07 | Disposition: A | Discharge status: Home / Self Care | Payer: Medicaid Other | Source: Ambulatory Visit | Attending: Cardiology | Admitting: Cardiology

## 2021-11-07 ENCOUNTER — Other Ambulatory Visit: Payer: Self-pay

## 2021-11-07 ENCOUNTER — Encounter: Payer: Self-pay | Admitting: Family Medicine

## 2021-11-07 ENCOUNTER — Ambulatory Visit (INDEPENDENT_AMBULATORY_CARE_PROVIDER_SITE_OTHER): Payer: Medicaid Other | Admitting: Family Medicine

## 2021-11-07 VITALS — BP 118/68 | HR 97 | Temp 97.1°F | Ht 66.0 in | Wt 172.8 lb

## 2021-11-07 DIAGNOSIS — R011 Cardiac murmur, unspecified: Secondary | ICD-10-CM | POA: Diagnosis not present

## 2021-11-07 DIAGNOSIS — M940 Chondrocostal junction syndrome [Tietze]: Secondary | ICD-10-CM | POA: Diagnosis not present

## 2021-11-07 LAB — ECHOCARDIOGRAM COMPLETE
AR max vel: 2.34 cm2
AV Area VTI: 2.25 cm2
AV Area mean vel: 2.44 cm2
AV Mean grad: 5 mmHg
AV Peak grad: 8.6 mmHg
Ao pk vel: 1.47 m/s
Area-P 1/2: 4.08 cm2
S' Lateral: 3.3 cm

## 2021-11-07 NOTE — Progress Notes (Signed)
?  Echocardiogram ?2D Echocardiogram has been performed. ? ?Elmer Ramp ?11/07/2021, 8:13 AM ?

## 2021-11-07 NOTE — Progress Notes (Signed)
? ?Established Patient Office Visit ? ?Subjective:  ?Patient ID: Cathy Hamilton, female    DOB: 04/09/1976  Age: 46 y.o. MRN: [id number] ? ?CC:  ?Chief Complaint  ?Patient presents with  ? Chest Pain  ?  Tenderness in chest, little sore when breathing in some burning feeling symptoms x 1 week. Back little sore from time to time.   ? ? ?HPI ?Cathy Hamilton presents for pain in her left anterior chest wall.  Pain is aching creased with movement through the chest.  She notices it when she takes a deep breath.  There has been no fever chills.  Denies cough or wheezing.  She has been working out in [employer].  Pain is not exacerbated by aerobic exercise.  There is no DOE diaphoresis.  Echocardiogram today for work-up of heart murmur heard on exam by cardiologist.  Coronary calcium score has been ? ?Past Medical History:  ?Diagnosis Date  ? Acute headache 01/10/2017  ? Allergy   ? Cervical radiculopathy 08/16/2019  ? Chest wall pain 09/11/2018  ? Chronic abdominal pain 01/10/2017  ? Congestion of upper airway 06/06/2021  ? Ear congestion 12/27/2019  ? Elevated cholesterol 10/03/2012  ? Formatting of this note might be different from the original. Overview:  10 year risk for heart disease less than 3%  ? Elevated LDL cholesterol level 10/03/2012  ? 10 year risk for heart disease less than 3%  ? GERD (gastroesophageal reflux disease)   ? with pregnancy-no meds  ? Healthcare maintenance 11/23/2019  ? 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 Hyperlipidemia   ? Jaw pain 04/01/2019  ? Lumbar radiculopathy 08/16/2019  ? Migraine without aura and without status migrainosus, not intractable 06/28/2021  ? Muscle fasciculation 11/23/2019  ? Neck pain 04/01/2019  ? Formatting of this note might be different from the original. USG right neck negative.  Last Assessment & Plan:  Formatting of this note might be different from the original. Concern about her neck. Chronic history of intermittent discomfort in the primarily right side of her neck.   She has been doing some physical therapy herself along with wearing a night brace for TMJ and does feel symptoms are  ? 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 Post-nasal drip 06/06/2021  ? Previous cesarean delivery affecting pregnancy 03/21/2012  ? Previous cesarean section 10/25/2020  ? S/P cesarean section 03/21/2012  ? Strain of calf muscle 12/13/2020  ? Submandibular gland swelling 01/23/2018  ? Thoracic myofascial strain 12/20/2020  ? Vitamin D deficiency 06/28/2021  ? ? ?Past Surgical History:  ?Procedure Laterality Date  ? CESAREAN SECTION  2009, 2011  ? CESAREAN SECTION  03/20/2012  ? Procedure: CESAREAN SECTION;  Surgeon: Darlyn Chamber, MD;  Location: [facility];  Service: Gynecology;  Laterality: N/A;  ? CESAREAN SECTION N/A 10/25/2020  ? Procedure: REPEAT CESAREAN SECTION PREVIOUS X 3  EDC: 10-30-20 ALLERGIES: PENICILLINS;  Surgeon: Dian Queen, MD;  Location: [facility];  Service: Obstetrics;  Laterality: N/A;  ? ? ?Family History  ?Problem Relation Age of Onset  ? Hyperlipidemia Mother   ? Diabetes Mother   ? Arthritis Mother   ? Cataracts Mother   ? Alcohol abuse Father   ? Stroke Father   ? Emphysema Father   ? Hyperlipidemia Sister   ? Anemia Sister   ? Cancer Maternal Grandfather   ? Hyperlipidemia Sister   ? Anemia Sister   ? ? ?Social History  ? ?Socioeconomic History  ? Marital status: Married  ?  Spouse name: Not on file  ? Number of children: Not on file  ? Years of education: Not on file  ? Highest education level: Not on file  ?Occupational History  ? Not on file  ?Tobacco Use  ? Smoking status: Never  ? Smokeless tobacco: Never  ?Vaping Use  ? Vaping Use: Never used  ?Substance and Sexual Activity  ? Alcohol use: No  ? Drug use: No  ? Sexual activity: Yes  ?  Birth control/protection: Pill  ?Other Topics Concern  ? Not on file  ?Social History Narrative  ? Not on file  ? ?Social Determinants of Health  ? ?Financial Resource Strain: Not on file  ?Food Insecurity: Not on file  ?Transportation Needs: Not  on file  ?Physical Activity: Not on file  ?Stress: Not on file  ?Social Connections: Not on file  ?Intimate Partner Violence: Not on file  ? ? ?Outpatient Medications Prior to Visit  ?Medication Sig Dispense Refill  ? atorvastatin (LIPITOR) 40 MG tablet Take 1 tablet (40 mg total) by mouth daily. 90 tablet 3  ? cholecalciferol (VITAMIN D3) 25 MCG (1000 UNIT) tablet Take 2,000 Units by mouth daily.    ? ibuprofen (ADVIL) 800 MG tablet Take 1 tablet (800 mg total) by mouth every 8 (eight) hours. 30 tablet 0  ? Prenatal Vit-Fe Fumarate-FA (PRENATAL MULTIVITAMIN) TABS tablet Take 1 tablet by mouth daily.    ? SUMAtriptan (IMITREX) 50 MG tablet Take 1 tablet (50 mg total) by mouth every 2 (two) hours as needed for migraine. May repeat in 2 hours if headache persists or recurs. No more than 2 each day. 10 tablet 0  ? ?No facility-administered medications prior to visit.  ? ? ?Allergies  ?Allergen Reactions  ? Other Itching  ?  "Tumeric causes my lips to itch and break out".   ? Penicillins Rash and Other (See Comments)  ?  Childhood reaction  ? ? ?ROS ?Review of Systems  ?Constitutional:  Negative for chills, diaphoresis, fatigue, fever and unexpected weight change.  ?HENT: Negative.    ?Eyes:  Negative for photophobia and visual disturbance.  ?Respiratory: Negative.  Negative for cough, chest tightness, shortness of breath and wheezing.   ?Cardiovascular:  Positive for chest pain. Negative for palpitations and leg swelling.  ?Gastrointestinal: Negative.   ?Genitourinary: Negative.   ?Neurological:  Negative for speech difficulty and weakness.  ? ?  ?Objective:  ?  ?Physical Exam ?Vitals and nursing note reviewed.  ?Constitutional:   ?   General: She is not in acute distress. ?   Appearance: She is well-developed. She is not ill-appearing, toxic-appearing or diaphoretic.  ?HENT:  ?   Head: Normocephalic and atraumatic.  ?Eyes:  ?   Extraocular Movements: Extraocular movements intact.  ?   Pupils: Pupils are equal, round,  and reactive to light.  ?Neck:  ?   Thyroid: No thyromegaly.  ?   Vascular: No hepatojugular reflux.  ?   Trachea: No tracheal deviation.  ?Cardiovascular:  ?   Rate and Rhythm: Normal rate and regular rhythm.  ?   Heart sounds: Normal heart sounds.  ?Pulmonary:  ?   Effort: Pulmonary effort is normal.  ?   Breath sounds: Normal breath sounds.  ?Abdominal:  ?   General: Bowel sounds are normal.  ?Musculoskeletal:  ?   Cervical back: Neck supple.  ?Lymphadenopathy:  ?   Cervical: No cervical adenopathy.  ?Skin: ?   General: Skin is warm and dry.  ?Neurological:  ?  General: No focal deficit present.  ?   Mental Status: She is alert.  ?Psychiatric:     ?   Mood and Affect: Mood normal.     ?   Behavior: Behavior normal.  ? ?BP 118/68 (BP Location: Right Arm, Patient Position: Sitting, Cuff Size: Normal)   Pulse 97   Temp (!) 97.1 ?F (36.2 ?C) (Temporal)   Ht '5\' 6"'$  (1.676 m)   Wt 172 lb 12.8 oz (78.4 kg)   SpO2 100%   BMI 27.89 kg/m?  ?Wt Readings from Last 3 Encounters:  ?11/07/21 172 lb 12.8 oz (78.4 kg)  ?10/22/21 172 lb 1.3 oz (78.1 kg)  ?09/10/21 180 lb (81.6 kg)  ? ? ? ?Health Maintenance Due  ?Topic Date Due  ? Hepatitis C Screening  Never done  ? ? ?There are no preventive care reminders to display for this patient. ? ?Lab Results  ?Component Value Date  ? TSH 1.42 05/09/2021  ? ?Lab Results  ?Component Value Date  ? WBC 4.7 12/20/2020  ? HGB 14.8 12/20/2020  ? HCT 44.5 12/20/2020  ? MCV 83.8 12/20/2020  ? PLT 191.0 12/20/2020  ? ?Lab Results  ?Component Value Date  ? NA 140 06/28/2021  ? K 4.0 06/28/2021  ? CO2 29 06/28/2021  ? GLUCOSE 73 06/28/2021  ? BUN 8 06/28/2021  ? CREATININE 0.85 06/28/2021  ? BILITOT 0.8 06/28/2021  ? ALKPHOS 69 06/28/2021  ? AST 18 06/28/2021  ? ALT 16 06/28/2021  ? PROT 6.7 06/28/2021  ? ALBUMIN 4.5 06/28/2021  ? CALCIUM 9.4 06/28/2021  ? GFR 83.59 06/28/2021  ? ?Lab Results  ?Component Value Date  ? CHOL 187 06/28/2021  ? ?Lab Results  ?Component Value Date  ? HDL 47.30  06/28/2021  ? ?Lab Results  ?Component Value Date  ? LDLCALC 125 (H) 06/28/2021  ? ?Lab Results  ?Component Value Date  ? TRIG 74.0 06/28/2021  ? ?Lab Results  ?Component Value Date  ? CHOLHDL 4 06/28/2021  ? ?[place]
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
## 2021-12-24 ENCOUNTER — Encounter: Payer: Self-pay | Admitting: Family Medicine

## 2021-12-24 ENCOUNTER — Ambulatory Visit (INDEPENDENT_AMBULATORY_CARE_PROVIDER_SITE_OTHER): Payer: Medicaid Other | Admitting: Family Medicine

## 2021-12-24 VITALS — BP 102/68 | HR 85 | Temp 96.9°F | Ht 66.0 in | Wt 169.2 lb

## 2021-12-24 DIAGNOSIS — G43009 Migraine without aura, not intractable, without status migrainosus: Secondary | ICD-10-CM

## 2021-12-24 DIAGNOSIS — E7800 Pure hypercholesterolemia, unspecified: Secondary | ICD-10-CM

## 2021-12-24 DIAGNOSIS — Z0000 Encounter for general adult medical examination without abnormal findings: Secondary | ICD-10-CM

## 2021-12-24 DIAGNOSIS — E559 Vitamin D deficiency, unspecified: Secondary | ICD-10-CM

## 2021-12-24 LAB — CBC WITH DIFFERENTIAL/PLATELET
Basophils Absolute: 0 10*3/uL (ref 0.0–0.1)
Basophils Relative: 0.4 % (ref 0.0–3.0)
Eosinophils Absolute: 0 10*3/uL (ref 0.0–0.7)
Eosinophils Relative: 0.7 % (ref 0.0–5.0)
HCT: 42.2 % (ref 36.0–46.0)
Hemoglobin: 13.7 g/dL (ref 12.0–15.0)
Lymphocytes Relative: 22.7 % (ref 12.0–46.0)
Lymphs Abs: 1 10*3/uL (ref 0.7–4.0)
MCHC: 32.5 g/dL (ref 30.0–36.0)
MCV: 84.2 fl (ref 78.0–100.0)
Monocytes Absolute: 0.4 10*3/uL (ref 0.1–1.0)
Monocytes Relative: 8.3 % (ref 3.0–12.0)
Neutro Abs: 3 10*3/uL (ref 1.4–7.7)
Neutrophils Relative %: 67.9 % (ref 43.0–77.0)
Platelets: 188 10*3/uL (ref 150.0–400.0)
RBC: 5.01 Mil/uL (ref 3.87–5.11)
RDW: 13.9 % (ref 11.5–15.5)
WBC: 4.5 10*3/uL (ref 4.0–10.5)

## 2021-12-24 LAB — URINALYSIS
Bilirubin Urine: NEGATIVE
Ketones, ur: NEGATIVE
Leukocytes,Ua: NEGATIVE
Nitrite: NEGATIVE
Specific Gravity, Urine: 1.02 (ref 1.000–1.030)
Total Protein, Urine: NEGATIVE
Urine Glucose: NEGATIVE
Urobilinogen, UA: 0.2 (ref 0.0–1.0)
pH: 7 (ref 5.0–8.0)

## 2021-12-24 LAB — VITAMIN D 25 HYDROXY (VIT D DEFICIENCY, FRACTURES): VITD: 44.68 ng/mL (ref 30.00–100.00)

## 2021-12-24 MED ORDER — SUMATRIPTAN SUCCINATE 50 MG PO TABS: 50.0000 mg | ORAL_TABLET | ORAL | 0 refills | Status: DC | PRN

## 2021-12-24 NOTE — Progress Notes (Signed)
? ?Established Patient Office Visit ? ?Subjective   ?Patient ID: Cathy Hamilton, female    DOB: 12/23/1975  Age: 46 y.o. MRN: [id number] ? ?Chief Complaint  ?Patient presents with  ? Annual Exam  ?  CPE, no concerns. Patient fasting.   ? ? ?HPI for health check and follow-up of elevated ldl cholesterol.  Seems to be tolerating the moderate dose of atorvastatin without issue.  Occasional joint aches that do not seem to be related to the statin.  Moderate amount of stress and goings on with her family.  They are considering moving to a different house.  Husband is away a lot on mission trips.  Homeschooling her kids.  He is thinking about returning to work part-time.  Kids are active with sports.  Seeing a dentist regularly care is through GYN ? ? ? ?Review of Systems  ?Constitutional:  Negative for chills, diaphoresis, malaise/fatigue and weight loss.  ?HENT: Negative.    ?Eyes: Negative.  Negative for blurred vision and double vision.  ?Cardiovascular:  Negative for chest pain.  ?Gastrointestinal:  Negative for abdominal pain.  ?Genitourinary: Negative.   ?Musculoskeletal:  Negative for falls and myalgias.  ?Neurological:  Negative for speech change, loss of consciousness and weakness.  ?Psychiatric/Behavioral: Negative.    ? ?  ?Objective:  ?  ? ?BP 102/68 (BP Location: Left Arm, Patient Position: Sitting, Cuff Size: Normal)   Pulse 85   Temp (!) 96.9 ?F (36.1 ?C) (Temporal)   Ht '5\' 6"'$  (1.676 m)   Wt 169 lb 3.2 oz (76.7 kg)   SpO2 99%   BMI 27.31 kg/m?  ? ? ?Physical Exam ?Constitutional:   ?   General: She is not in acute distress. ?   Appearance: Normal appearance. She is not ill-appearing, toxic-appearing or diaphoretic.  ?HENT:  ?   Head: Normocephalic and atraumatic.  ?   Right Ear: External ear normal.  ?   Left Ear: External ear normal.  ?   Mouth/Throat:  ?   Mouth: Mucous membranes are moist.  ?   Pharynx: Oropharynx is clear. No oropharyngeal exudate or posterior oropharyngeal erythema.  ?Eyes:   ?   General: No scleral icterus.    ?   Right eye: No discharge.     ?   Left eye: No discharge.  ?   Extraocular Movements: Extraocular movements intact.  ?   Conjunctiva/sclera: Conjunctivae normal.  ?   Pupils: Pupils are equal, round, and reactive to light.  ?Cardiovascular:  ?   Rate and Rhythm: Normal rate and regular rhythm.  ?Pulmonary:  ?   Effort: Pulmonary effort is normal. No respiratory distress.  ?   Breath sounds: Normal breath sounds.  ?Abdominal:  ?   General: Bowel sounds are normal.  ?Musculoskeletal:  ?   Cervical back: No rigidity or tenderness.  ?Lymphadenopathy:  ?   Cervical: No cervical adenopathy.  ?Skin: ?   General: Skin is warm and dry.  ?Neurological:  ?   Mental Status: She is alert and oriented to person, place, and time.  ?Psychiatric:     ?   Mood and Affect: Mood normal.     ?   Behavior: Behavior normal.  ? ? ? ?No results found for any visits on 12/24/21. ? ? ? ?The 10-year ASCVD risk score (Arnett DK, et al., 2019) is: 0.4% ? ?  ?Assessment & Plan:  ? ?Problem List Items Addressed This Visit   ? ?  ? Cardiovascular and Mediastinum  ?  Migraine without aura and without status migrainosus, not intractable  ? Relevant Medications  ? SUMAtriptan (IMITREX) 50 MG tablet  ?  ? Other  ? Elevated LDL cholesterol level  ? Relevant Orders  ? Comprehensive metabolic panel  ? Lipid panel  ? Healthcare maintenance - Primary  ? Relevant Orders  ? CBC with Differential/Platelet  ? Urinalysis  ? Vitamin D deficiency  ? Relevant Orders  ? VITAMIN D 25 Hydroxy (Vit-D Deficiency, Fractures)  ? ? ?Return in about 6 months (around 06/26/2022).  ? ?Continue exercise by walking as tolerated and as time allows.  Information was given maintenance disease.  Tension.  Continue follow-up with dentist and GYN physician.  Continue atorvastatin.  Did discuss holiday from statin to determine if statin is associated with myalgias. ?Libby Maw, MD ? ?

## 2021-12-25 LAB — LIPID PANEL
Cholesterol: 153 mg/dL (ref 0–200)
HDL: 50.2 mg/dL (ref >39.00)
LDL Cholesterol: 92 mg/dL (ref 0–99)
NonHDL: 102.41
Total CHOL/HDL Ratio: 3
Triglycerides: 50 mg/dL (ref 0.0–149.0)
VLDL: 10 mg/dL (ref 0.0–40.0)

## 2021-12-25 LAB — COMPREHENSIVE METABOLIC PANEL
ALT: 19 U/L (ref 0–35)
AST: 15 U/L (ref 0–37)
Albumin: 4.2 g/dL (ref 3.5–5.2)
Alkaline Phosphatase: 58 U/L (ref 39–117)
BUN: 13 mg/dL (ref 6–23)
CO2: 24 mEq/L (ref 19–32)
Calcium: 9.2 mg/dL (ref 8.4–10.5)
Chloride: 104 mEq/L (ref 96–112)
Creatinine, Ser: 0.94 mg/dL (ref 0.40–1.20)
GFR: 73.82 mL/min (ref >60.00)
Glucose, Bld: 89 mg/dL (ref 70–99)
Potassium: 4.3 mEq/L (ref 3.5–5.1)
Sodium: 137 mEq/L (ref 135–145)
Total Bilirubin: 0.8 mg/dL (ref 0.2–1.2)
Total Protein: 6.7 g/dL (ref 6.0–8.3)

## 2022-02-28 ENCOUNTER — Encounter: Payer: Self-pay | Admitting: Emergency Medicine

## 2022-02-28 ENCOUNTER — Emergency Department (HOSPITAL_BASED_OUTPATIENT_CLINIC_OR_DEPARTMENT_OTHER)
Admission: EM | Admit: 2022-02-28 | Discharge: 2022-02-28 | Discharge status: Absent without leave | Payer: Medicaid Other | Source: Ambulatory Visit

## 2022-02-28 ENCOUNTER — Ambulatory Visit (HOSPITAL_BASED_OUTPATIENT_CLINIC_OR_DEPARTMENT_OTHER)
Admission: RE | Admit: 2022-02-28 | Discharge: 2022-02-28 | Disposition: A | Discharge status: Home / Self Care | Payer: Medicaid Other | Source: Ambulatory Visit | Attending: Emergency Medicine | Admitting: Emergency Medicine

## 2022-02-28 ENCOUNTER — Ambulatory Visit
Admission: EM | Admit: 2022-02-28 | Discharge: 2022-02-28 | Disposition: A | Discharge status: Home / Self Care | Payer: Medicaid Other | Attending: Emergency Medicine | Admitting: Emergency Medicine

## 2022-02-28 DIAGNOSIS — M79672 Pain in left foot: Secondary | ICD-10-CM | POA: Diagnosis not present

## 2022-02-28 NOTE — ED Triage Notes (Signed)
Pt here with sharp left foot pain near 5th toe after stepping wrong over a week ago. No brusing or swelling, but pain is still present despite OTC and home remedies.

## 2022-02-28 NOTE — ED Provider Notes (Signed)
[facility]    CSN: [id number] Arrival date & time: 02/28/22  1749    HISTORY   Chief Complaint  Patient presents with   Foot Pain   HPI Cathy Hamilton is a pleasant, 46 y.o. female who presents to urgent care today. Pt here with acute onset of left foot pain near 5th toe after stepping wrong over a week ago, states she thinks she heard it pop when she stepped wrong.  Patient did ambulate independently into the clinic today.  Patient denies brusing or swelling of left foot, but pain is still present despite taking 1 Aleve daily for the past 3 days.  The history is provided by the patient.  Foot Pain This is a new problem. The current episode started more than 2 days ago. The problem occurs constantly. The problem has not changed since onset.The symptoms are aggravated by walking. Nothing relieves the symptoms. The treatment provided no relief.   Past Medical History:  Diagnosis Date   Acute headache 01/10/2017   Allergy    Cervical radiculopathy 08/16/2019   Chest wall pain 09/11/2018   Chronic abdominal pain 01/10/2017   Congestion of upper airway 06/06/2021   Ear congestion 12/27/2019   Elevated cholesterol 10/03/2012   Formatting of this note might be different from the original. Overview:  10 year risk for heart disease less than 3%   Elevated LDL cholesterol level 10/03/2012   10 year risk for heart disease less than 3%   GERD (gastroesophageal reflux disease)    with pregnancy-no meds   Healthcare maintenance 11/23/2019   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   Hyperlipidemia    Jaw pain 04/01/2019   Lumbar radiculopathy 08/16/2019   Migraine without aura and without status migrainosus, not intractable 06/28/2021   Muscle fasciculation 11/23/2019   Neck pain 04/01/2019   Formatting of this note might be different from the original. USG right neck negative.  Last Assessment & Plan:  Formatting of this note might be different from the original. Concern about her neck.  Chronic history of intermittent discomfort in the primarily right side of her neck.  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   Post-nasal drip 06/06/2021   Previous cesarean delivery affecting pregnancy 03/21/2012   Previous cesarean section 10/25/2020   S/P cesarean section 03/21/2012   Strain of calf muscle 12/13/2020   Submandibular gland swelling 01/23/2018   Thoracic myofascial strain 12/20/2020   Vitamin D deficiency 06/28/2021   Patient Active Problem List   Diagnosis Date Noted   Costochondritis 11/07/2021   Cardiac murmur 10/22/2021   Allergy 10/18/2021   GERD (gastroesophageal reflux disease) 10/18/2021   Hyperlipidemia 10/18/2021   Vitamin D deficiency 06/28/2021   Migraine without aura and without status migrainosus, not intractable 06/28/2021   Post-nasal drip 06/06/2021   Congestion of upper airway 06/06/2021   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   Thoracic myofascial strain 12/20/2020   Strain of calf muscle 12/13/2020   Previous cesarean section 10/25/2020   Ear congestion 12/27/2019   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   Muscle fasciculation 11/23/2019   Healthcare maintenance 11/23/2019   Cervical radiculopathy 08/16/2019   Lumbar radiculopathy 08/16/2019   Neck pain 04/01/2019   Jaw pain 04/01/2019   Chest wall pain 09/11/2018   Submandibular gland swelling 01/23/2018   Acute headache 01/10/2017   Chronic abdominal pain 01/10/2017   Elevated  LDL cholesterol level 10/03/2012   Elevated cholesterol 10/03/2012   Previous cesarean delivery affecting pregnancy 03/21/2012    Class: Present on Admission   S/P cesarean section 03/21/2012    Class: Status post   Past Surgical History:  Procedure Laterality Date   CESAREAN SECTION  2009, 2011   CESAREAN SECTION  03/20/2012   Procedure: CESAREAN SECTION;  Surgeon: Darlyn Chamber, MD;  Location: [facility];   Service: Gynecology;  Laterality: N/A;   CESAREAN SECTION N/A 10/25/2020   Procedure: REPEAT CESAREAN SECTION PREVIOUS X 3  EDC: 10-30-20 ALLERGIES: PENICILLINS;  Surgeon: Dian Queen, MD;  Location: [facility];  Service: Obstetrics;  Laterality: N/A;   OB History     Gravida  6   Para  4   Term  3   Preterm      AB  2   Living  4      SAB      IAB  2   Ectopic      Multiple  0   Live Births  4          Home Medications    Prior to Admission medications   Medication Sig Start Date End Date Taking? Authorizing Provider  atorvastatin (LIPITOR) 40 MG tablet Take 1 tablet (40 mg total) by mouth daily. 07/02/21   Libby Maw, MD  cholecalciferol (VITAMIN D3) 25 MCG (1000 UNIT) tablet Take 2,000 Units by mouth daily.    [provider]  ibuprofen (ADVIL) 800 MG tablet Take 1 tablet (800 mg total) by mouth every 8 (eight) hours. 10/28/20   [name], MD  SUMAtriptan (IMITREX) 50 MG tablet Take 1 tablet (50 mg total) by mouth every 2 (two) hours as needed for migraine. May repeat in 2 hours if headache persists or recurs. No more than 2 each day. 12/24/21   Libby Maw, MD    Family History Family History  Problem Relation Age of Onset   Hyperlipidemia Mother    Diabetes Mother    Arthritis Mother    Cataracts Mother    Alcohol abuse Father    Stroke Father    Emphysema Father    Hyperlipidemia Sister    Anemia Sister    Hyperlipidemia Sister    Anemia Sister    Cancer Maternal Grandfather    Social History Social History   Tobacco Use   Smoking status: Never   Smokeless tobacco: Never  Vaping Use   Vaping Use: Never used  Substance Use Topics   Alcohol use: No   Drug use: No   Allergies   Other and Penicillins  Review of Systems Review of Systems Pertinent findings revealed after performing a 14 point review of systems has been noted in the history of present illness.  Physical Exam Triage Vital Signs ED  Triage Vitals  Enc Vitals Group     BP 06/08/21 0827 (!) 147/82     Pulse Rate 06/08/21 0827 72     Resp 06/08/21 0827 18     Temp 06/08/21 0827 98.3 F (36.8 C)     Temp Source 06/08/21 0827 Oral     SpO2 06/08/21 0827 98 %     Weight --      Height --      Head Circumference --      Peak Flow --      Pain Score 06/08/21 0826 5     Pain Loc --      Pain  Edu? --      Excl. in [place]? --    Updated Vital Signs BP 114/64   Pulse 85   Temp 98.6 F (37 C)   Resp 20   SpO2 98%   Physical Exam Vitals and nursing note reviewed.  Constitutional:      General: She is not in acute distress.    Appearance: Normal appearance.  HENT:     Head: Normocephalic and atraumatic.  Eyes:     Pupils: Pupils are equal, round, and reactive to light.  Cardiovascular:     Rate and Rhythm: Normal rate and regular rhythm.  Pulmonary:     Effort: Pulmonary effort is normal.     Breath sounds: Normal breath sounds.  Musculoskeletal:        General: Tenderness present. No swelling, deformity or signs of injury. Normal range of motion.     Cervical back: Normal range of motion and neck supple.     Right lower leg: No edema.     Left lower leg: No edema.  Skin:    General: Skin is warm and dry.  Neurological:     General: No focal deficit present.     Mental Status: She is alert and oriented to person, place, and time. Mental status is at baseline.  Psychiatric:        Mood and Affect: Mood normal.        Behavior: Behavior normal.        Thought Content: Thought content normal.        Judgment: Judgment normal.     UC Couse / Diagnostics / Procedures:     Radiology No results found.  Procedures Procedures (including critical care time) EKG  Pending results:  Labs Reviewed - No data to display  Medications Ordered in UC: Medications - No data to display  UC Diagnoses / Final Clinical Impressions(s)   I have reviewed the triage vital signs and the nursing notes.  Pertinent labs  & imaging results that were available during my care of the patient were reviewed by me and considered in my medical decision making (see chart for details).    Final diagnoses:  Acute pain of left foot   Patient advised to go to [place] [facility] [place] to have x-ray of her left foot performed.  Patient advised we will contact her with the results once received and provide her with further recommendations, NAD.  Patient advised that she can take 2 Aleve's twice daily or ibuprofen 800 mg 3 times daily for pain relief.  Patient also advised to keep her foot elevated and ice it is much as possible.  ED Prescriptions   None    PDMP not reviewed this encounter.  Discharge Instructions:   Discharge Instructions      For pain relief, I recommend that you consider taking either Aleve 2 tablets every 12 hours or some version of ibuprofen (Advil, Motrin) 800 mg 3 times daily.  You may also find it helpful to keep your foot elevated and apply an ice pack for 20 minutes 3-4 times daily.  Please go to [facility] at [facility]	[place] the room entrance and let them know that you were seen here at urgent care today and that you have an x-ray ordered.  They will get your x-ray done and sent me the report usually within 30 to 45 minutes.  Based on the results of your x-ray, we will provide you with further recommendations.  Thank you for visiting urgent care today.        Disposition Upon Discharge:  Condition: stable for discharge home Home: take medications as prescribed; routine discharge instructions as discussed; follow up as advised.  Patient presented with an acute illness with associated systemic symptoms and significant discomfort requiring urgent management. In my opinion, this is a condition that a prudent lay person (someone who possesses an average knowledge of health and medicine) may potentially expect to result in complications if not addressed urgently such as  respiratory distress, impairment of bodily function or dysfunction of bodily organs.   Routine symptom specific, illness specific and/or disease specific instructions were discussed with the patient and/or caregiver at length.   As such, the patient has been evaluated and assessed, work-up was performed and treatment was provided in alignment with urgent care protocols and evidence based medicine.  Patient/parent/caregiver has been advised that the patient may require follow up for further testing and treatment if the symptoms continue in spite of treatment, as clinically indicated and appropriate.  Patient/parent/caregiver has been advised to report to orthopedic urgent care clinic or return to the [facility] or PCP in 3-5 days if no better; follow-up with orthopedics, PCP or the Emergency Department if new signs and symptoms develop or if the current signs or symptoms continue to change or worsen for further workup, evaluation and treatment as clinically indicated and appropriate  The patient will follow up with their current PCP if and as advised. If the patient does not currently have a PCP we will have assisted them in obtaining one.   The patient may need specialty follow up if the symptoms continue, in spite of conservative treatment and management, for further workup, evaluation, consultation and treatment as clinically indicated and appropriate.  Patient/parent/caregiver verbalized understanding and agreement of plan as discussed.  All questions were addressed during visit.  Please see discharge instructions below for further details of plan.  This office note has been dictated using Museum/gallery curator.  Unfortunately, this method of dictation can sometimes lead to typographical or grammatical errors.  I apologize for your inconvenience in advance if this occurs.  Please do not hesitate to reach out to me if clarification is needed.      Lynden Oxford Scales, PA-C 02/28/22  1818

## 2022-02-28 NOTE — Discharge Instructions (Signed)
For pain relief, I recommend that you consider taking either Aleve 2 tablets every 12 hours or some version of ibuprofen (Advil, Motrin) 800 mg 3 times daily.  You may also find it helpful to keep your foot elevated and apply an ice pack for 20 minutes 3-4 times daily.  Please go to [facility] at [facility]	[place] the room entrance and let them know that you were seen here at urgent care today and that you have an x-ray ordered.  They will get your x-ray done and sent me the report usually within 30 to 45 minutes.  Based on the results of your x-ray, we will provide you with further recommendations.  Thank you for visiting urgent care today.

## 2022-07-07 ENCOUNTER — Other Ambulatory Visit: Payer: Self-pay | Admitting: Family Medicine

## 2022-07-07 DIAGNOSIS — E7800 Pure hypercholesterolemia, unspecified: Secondary | ICD-10-CM

## 2023-04-17 ENCOUNTER — Ambulatory Visit (INDEPENDENT_AMBULATORY_CARE_PROVIDER_SITE_OTHER): Payer: Medicaid Other | Admitting: Family Medicine

## 2023-04-17 ENCOUNTER — Encounter: Payer: Self-pay | Admitting: Family Medicine

## 2023-04-17 VITALS — BP 122/70 | HR 70 | Temp 97.7°F | Ht 66.0 in | Wt 172.2 lb

## 2023-04-17 DIAGNOSIS — Z23 Encounter for immunization: Secondary | ICD-10-CM

## 2023-04-17 DIAGNOSIS — Z1211 Encounter for screening for malignant neoplasm of colon: Secondary | ICD-10-CM | POA: Diagnosis not present

## 2023-04-17 DIAGNOSIS — E7800 Pure hypercholesterolemia, unspecified: Secondary | ICD-10-CM

## 2023-04-17 DIAGNOSIS — R7303 Prediabetes: Secondary | ICD-10-CM | POA: Diagnosis not present

## 2023-04-17 DIAGNOSIS — Z0000 Encounter for general adult medical examination without abnormal findings: Secondary | ICD-10-CM | POA: Diagnosis not present

## 2023-04-17 LAB — COMPREHENSIVE METABOLIC PANEL
ALT: 36 U/L — ABNORMAL HIGH (ref 0–35)
AST: 27 U/L (ref 0–37)
Albumin: 4.2 g/dL (ref 3.5–5.2)
Alkaline Phosphatase: 63 U/L (ref 39–117)
BUN: 10 mg/dL (ref 6–23)
CO2: 31 meq/L (ref 19–32)
Calcium: 9.6 mg/dL (ref 8.4–10.5)
Chloride: 103 meq/L (ref 96–112)
Creatinine, Ser: 0.85 mg/dL (ref 0.40–1.20)
GFR: 82.54 mL/min (ref >60.00)
Glucose, Bld: 94 mg/dL (ref 70–99)
Potassium: 4.5 meq/L (ref 3.5–5.1)
Sodium: 138 meq/L (ref 135–145)
Total Bilirubin: 0.9 mg/dL (ref 0.2–1.2)
Total Protein: 6.7 g/dL (ref 6.0–8.3)

## 2023-04-17 LAB — URINALYSIS, ROUTINE W REFLEX MICROSCOPIC
Bilirubin Urine: NEGATIVE
Hgb urine dipstick: NEGATIVE
Ketones, ur: NEGATIVE
Leukocytes,Ua: NEGATIVE
Nitrite: NEGATIVE
RBC / HPF: NONE SEEN (ref 0–?)
Specific Gravity, Urine: <=1.005 — AB (ref 1.000–1.030)
Total Protein, Urine: NEGATIVE
Urine Glucose: NEGATIVE
Urobilinogen, UA: 0.2 (ref 0.0–1.0)
WBC, UA: NONE SEEN (ref 0–?)
pH: 6 (ref 5.0–8.0)

## 2023-04-17 LAB — CBC WITH DIFFERENTIAL/PLATELET
Basophils Absolute: 0 10*3/uL (ref 0.0–0.1)
Basophils Relative: 0.4 % (ref 0.0–3.0)
Eosinophils Absolute: 0.1 10*3/uL (ref 0.0–0.7)
Eosinophils Relative: 1.5 % (ref 0.0–5.0)
HCT: 44.4 % (ref 36.0–46.0)
Hemoglobin: 14.2 g/dL (ref 12.0–15.0)
Lymphocytes Relative: 22.8 % (ref 12.0–46.0)
Lymphs Abs: 1 10*3/uL (ref 0.7–4.0)
MCHC: 32 g/dL (ref 30.0–36.0)
MCV: 84.5 fl (ref 78.0–100.0)
Monocytes Absolute: 0.4 10*3/uL (ref 0.1–1.0)
Monocytes Relative: 9.4 % (ref 3.0–12.0)
Neutro Abs: 3 10*3/uL (ref 1.4–7.7)
Neutrophils Relative %: 65.9 % (ref 43.0–77.0)
Platelets: 192 10*3/uL (ref 150.0–400.0)
RBC: 5.25 Mil/uL — ABNORMAL HIGH (ref 3.87–5.11)
RDW: 13.8 % (ref 11.5–15.5)
WBC: 4.5 10*3/uL (ref 4.0–10.5)

## 2023-04-17 LAB — LIPID PANEL
Cholesterol: 171 mg/dL (ref 0–200)
HDL: 59.5 mg/dL (ref >39.00)
LDL Cholesterol: 99 mg/dL (ref 0–99)
NonHDL: 111.8
Total CHOL/HDL Ratio: 3
Triglycerides: 66 mg/dL (ref 0.0–149.0)
VLDL: 13.2 mg/dL (ref 0.0–40.0)

## 2023-04-17 LAB — HEMOGLOBIN A1C: Hgb A1c MFr Bld: 6.1 % (ref 4.6–6.5)

## 2023-04-17 NOTE — Progress Notes (Signed)
Established Patient Office Visit   Subjective:  Patient ID: Cathy Hamilton, female    DOB: 1976-02-15  Age: 47 y.o. MRN: [id number]  Chief Complaint  Patient presents with   Annual Exam    CPE: Pt is fasting.    HPI Encounter Diagnoses  Name Primary?   Healthcare maintenance Yes   Elevated LDL cholesterol level    Need for immunization against influenza    Screening for colon cancer    Prediabetes    In for a physical exam today in follow-up of elevated LDL cholesterol and prediabetes.  Continues to avoid meat.  Continues with atorvastatin without issue.  Mother has diabetes.  She is exercising some by walking.  She continues to go to the gym.  She is married with 4 children whose ages are 16, 54, 103 and 2.  Is a busy household.  They have recently moved.   Review of Systems  Constitutional: Negative.   HENT: Negative.    Eyes:  Negative for blurred vision, discharge and redness.  Respiratory: Negative.    Cardiovascular: Negative.   Gastrointestinal:  Negative for abdominal pain.  Genitourinary: Negative.   Musculoskeletal: Negative.  Negative for myalgias.  Skin:  Negative for rash.  Neurological:  Negative for tingling, loss of consciousness and weakness.  Endo/Heme/Allergies:  Negative for polydipsia.       04/17/2023    8:50 AM 12/24/2021   11:16 AM 12/24/2021   10:09 AM  Depression screen PHQ 2/9  Decreased Interest 0 1 0  Down, Depressed, Hopeless 0 1 1  PHQ - 2 Score 0 2 1  Altered sleeping 0 0   Tired, decreased energy 0 0   Change in appetite 0 0   Feeling bad or failure about yourself  0 0   Trouble concentrating 0 0   Moving slowly or fidgety/restless 0 0   Suicidal thoughts 0 0   PHQ-9 Score 0 2   Difficult doing work/chores Not difficult at all Somewhat difficult      Current Outpatient Medications:    atorvastatin (LIPITOR) 40 MG tablet, TAKE 1 TABLET BY MOUTH EVERY DAY, Disp: 90 tablet, Rfl: 3   cholecalciferol (VITAMIN D3) 25 MCG (1000  UNIT) tablet, Take 2,000 Units by mouth daily., Disp: , Rfl:    ibuprofen (ADVIL) 800 MG tablet, Take 1 tablet (800 mg total) by mouth every 8 (eight) hours., Disp: 30 tablet, Rfl: 0   SUMAtriptan (IMITREX) 50 MG tablet, Take 1 tablet (50 mg total) by mouth every 2 (two) hours as needed for migraine. May repeat in 2 hours if headache persists or recurs. No more than 2 each day., Disp: 10 tablet, Rfl: 0   Objective:     BP 122/70   Pulse 70   Temp 97.7 F (36.5 C)   Ht 5\' 6"  (1.676 m)   Wt 172 lb 3.2 oz (78.1 kg)   SpO2 99%   BMI 27.79 kg/m    Physical Exam Constitutional:      General: She is not in acute distress.    Appearance: Normal appearance. She is not ill-appearing, toxic-appearing or diaphoretic.  HENT:     Head: Normocephalic and atraumatic.     Right Ear: Tympanic membrane, ear canal and external ear normal.     Left Ear: Tympanic membrane, ear canal and external ear normal.     Mouth/Throat:     Mouth: Mucous membranes are moist.     Pharynx: Oropharynx is clear. No oropharyngeal exudate  or posterior oropharyngeal erythema.  Eyes:     General: No scleral icterus.       Right eye: No discharge.        Left eye: No discharge.     Extraocular Movements: Extraocular movements intact.     Conjunctiva/sclera: Conjunctivae normal.     Pupils: Pupils are equal, round, and reactive to light.  Cardiovascular:     Rate and Rhythm: Normal rate and regular rhythm.  Pulmonary:     Effort: Pulmonary effort is normal. No respiratory distress.     Breath sounds: Normal breath sounds.  Abdominal:     General: Bowel sounds are normal.  Musculoskeletal:     Cervical back: No rigidity or tenderness.  Skin:    General: Skin is warm and dry.  Neurological:     Mental Status: She is alert and oriented to person, place, and time.  Psychiatric:        Mood and Affect: Mood normal.        Behavior: Behavior normal.      No results found for any visits on 04/17/23.    The  10-year ASCVD risk score (Arnett DK, et al., 2019) is: 0.8%    Assessment & Plan:   Healthcare maintenance -     CBC with Differential/Platelet -     Urinalysis, Routine w reflex microscopic  Elevated LDL cholesterol level -     Comprehensive metabolic panel -     Lipid panel  Need for immunization against influenza -     Flu Vaccine QUAD 67mo+IM (Fluarix, Fluzone & Alfiuria Quad PF)  Screening for colon cancer -     Ambulatory referral to Gastroenterology  Prediabetes -     Hemoglobin A1c    Return in about 1 year (around 04/16/2024), or if symptoms worsen or fail to improve.    Mliss Sax, MD

## 2023-04-23 NOTE — Addendum Note (Signed)
Addended by: Solon Palm on: 04/23/2023 03:27 PM   Modules accepted: Orders

## 2023-06-10 ENCOUNTER — Ambulatory Visit (AMBULATORY_SURGERY_CENTER): Payer: Medicaid Other | Admitting: *Deleted

## 2023-06-10 VITALS — Ht 66.5 in | Wt 165.0 lb

## 2023-06-10 DIAGNOSIS — Z1211 Encounter for screening for malignant neoplasm of colon: Secondary | ICD-10-CM

## 2023-06-10 MED ORDER — NA SULFATE-K SULFATE-MG SULF 17.5-3.13-1.6 GM/177ML PO SOLN: 1.0000 | Freq: Once | ORAL | 0 refills | Status: AC

## 2023-06-10 NOTE — Progress Notes (Signed)
Pt's name and DOB verified at the beginning of the pre-visit wit 2 identifiers  Pt denies any difficulty with ambulating,sitting, laying down or rolling side to side  Gave both [facility] main # and MD on call # prior to instructions.   No egg or soy allergy known to patient   No issues known to pt with past sedation with any surgeries or procedures  Patient denies ever being intubated  Pt has no issues moving head neck or swallowing  No FH of Malignant Hyperthermia  Pt is not on diet pills or shots  Pt is not on home 02   Pt is not on blood thinners   Pt denies issues with constipation   Pt is not on dialysis  Pt denise any abnormal heart rhythms   Pt denies any upcoming cardiac testing  Pt encouraged to use to use Singlecare or Goodrx to reduce cost   Patient's chart reviewed by Cathlyn Parsons CNRA prior to pre-visit and patient appropriate for the [facility].  Pre-visit completed and red dot placed by patient's name on their procedure day (on provider's schedule).  .  Visit by phone  Pt states weight is 165lb  Instructed pt why it is important to and  to call if they have any changes in health or new medications. Directed them to the # given and on instructions.     Instructions reviewed with pt and pt states understanding. Instructed to review again prior to procedure. Pt states they will.   Instructions sent by mail with coupon and by my chart

## 2023-06-27 ENCOUNTER — Encounter: Payer: Self-pay | Admitting: Internal Medicine

## 2023-06-27 ENCOUNTER — Ambulatory Visit: Payer: Medicaid Other | Admitting: Internal Medicine

## 2023-06-27 VITALS — BP 101/62 | HR 76 | Temp 97.7°F | Resp 20 | Ht 66.0 in | Wt 165.0 lb

## 2023-06-27 DIAGNOSIS — D120 Benign neoplasm of cecum: Secondary | ICD-10-CM | POA: Diagnosis not present

## 2023-06-27 DIAGNOSIS — E785 Hyperlipidemia, unspecified: Secondary | ICD-10-CM | POA: Diagnosis not present

## 2023-06-27 DIAGNOSIS — Z1211 Encounter for screening for malignant neoplasm of colon: Secondary | ICD-10-CM | POA: Diagnosis not present

## 2023-06-27 DIAGNOSIS — K635 Polyp of colon: Secondary | ICD-10-CM

## 2023-06-27 MED ORDER — SODIUM CHLORIDE 0.9 % IV SOLN: 500.0000 mL | Freq: Once | INTRAVENOUS | Status: DC

## 2023-06-27 NOTE — Op Note (Signed)
[facility] Patient Name: Cathy Hamilton Procedure Date: 06/27/2023 9:18 AM MRN: [id number] Endoscopist: Particia Lather , , [id number] Age: 47 Referring MD:  Date of Birth: [birth year] Gender: Female Account #: [id number] Procedure:                Colonoscopy Indications:              Screening for colorectal malignant neoplasm, This                            is the patient's first colonoscopy Medicines:                Monitored Anesthesia Care Procedure:                Pre-Anesthesia Assessment:                           - Prior to the procedure, a History and Physical                            was performed, and patient medications and                            allergies were reviewed. The patient's tolerance of                            previous anesthesia was also reviewed. The risks                            and benefits of the procedure and the sedation                            options and risks were discussed with the patient.                            All questions were answered, and informed consent                            was obtained. Prior Anticoagulants: The patient has                            taken no anticoagulant or antiplatelet agents. ASA                            Grade Assessment: II - A patient with mild systemic                            disease. After reviewing the risks and benefits,                            the patient was deemed in satisfactory condition to                            undergo the procedure.  After obtaining informed consent, the colonoscope                            was passed under direct vision. Throughout the                            procedure, the patient's blood pressure, pulse, and                            oxygen saturations were monitored continuously. The                            Olympus Scope SN: [id number] was introduced through                            the anus and  advanced to the the terminal ileum.                            The colonoscopy was performed without difficulty.                            The patient tolerated the procedure well. The                            quality of the bowel preparation was excellent. The                            terminal ileum, ileocecal valve, appendiceal                            orifice, and rectum were photographed. Scope In: 9:21:35 AM Scope Out: 9:40:15 AM Scope Withdrawal Time: 0 hours 13 minutes 3 seconds  Total Procedure Duration: 0 hours 18 minutes 40 seconds  Findings:                 The terminal ileum appeared normal.                           A 3 mm polyp was found in the cecum. The polyp was                            sessile. The polyp was removed with a cold snare.                            Resection and retrieval were complete.                           Non-bleeding internal hemorrhoids were found during                            retroflexion. Complications:            No immediate complications. Estimated Blood Loss:     Estimated blood loss was minimal. Impression:               -  The examined portion of the ileum was normal.                           - One 3 mm polyp in the cecum, removed with a cold                            snare. Resected and retrieved.                           - Non-bleeding internal hemorrhoids. Recommendation:           - Discharge patient to home (with escort).                           - Await pathology results.                           - The findings and recommendations were discussed                            with the patient. Dr Particia Lather "Cathy Hamilton" Cathy Hamilton,  06/27/2023 9:51:39 AM

## 2023-06-27 NOTE — Progress Notes (Signed)
Pt's states no medical or surgical changes since previsit or office visit. 

## 2023-06-27 NOTE — Progress Notes (Signed)
A/ox3, pleased with MAC, report to RN 

## 2023-06-27 NOTE — Progress Notes (Signed)
GASTROENTEROLOGY PROCEDURE H&P NOTE   Primary Care Physician: Mliss Sax, MD    Reason for Procedure:   Colon cancer screening  Plan:    Colonoscopy  Patient is appropriate for endoscopic procedure(s) in the ambulatory ([facility]) setting.  The nature of the procedure, as well as the risks, benefits, and alternatives were carefully and thoroughly reviewed with the patient. Ample time for discussion and questions allowed. The patient understood, was satisfied, and agreed to proceed.     HPI: Cathy Hamilton is a 47 y.o. female who presents for colonoscopy for colon cancer screening. Denies blood in stools, changes in bowel habits, or unintentional weight loss. Denies family history of colon cancer.  Past Medical History:  Diagnosis Date   Acute headache 01/10/2017   Allergy    Cervical radiculopathy 08/16/2019   Chest wall pain 09/11/2018   Chronic abdominal pain 01/10/2017   Congestion of upper airway 06/06/2021   Ear congestion 12/27/2019   Elevated cholesterol 10/03/2012   Formatting of this note might be different from the original. Overview:  10 year risk for heart disease less than 3%   Elevated LDL cholesterol level 10/03/2012   10 year risk for heart disease less than 3%   GERD (gastroesophageal reflux disease)    with pregnancy-no meds   Healthcare maintenance 11/23/2019   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   Hyperlipidemia    Jaw pain 04/01/2019   Lumbar radiculopathy 08/16/2019   Migraine without aura and without status migrainosus, not intractable 06/28/2021   Muscle fasciculation 11/23/2019   Neck pain 04/01/2019   Formatting of this note might be different from the original. USG right neck negative.  Last Assessment & Plan:  Formatting of this note might be different from the original. Concern about her neck. Chronic history of intermittent discomfort in the primarily right side of her neck.  She has been doing some physical therapy herself along  with wearing a night brace for TMJ and does feel symptoms are   Post-nasal drip 06/06/2021   Previous cesarean delivery affecting pregnancy 03/21/2012   Previous cesarean section 10/25/2020   S/P cesarean section 03/21/2012   Strain of calf muscle 12/13/2020   Submandibular gland swelling 01/23/2018   Thoracic myofascial strain 12/20/2020   Vitamin D deficiency 06/28/2021    Past Surgical History:  Procedure Laterality Date   CESAREAN SECTION  2009, 2011   CESAREAN SECTION  03/20/2012   Procedure: CESAREAN SECTION;  Surgeon: Juluis Mire, MD;  Location: [facility];  Service: Gynecology;  Laterality: N/A;   CESAREAN SECTION N/A 10/25/2020   Procedure: REPEAT CESAREAN SECTION PREVIOUS X 3  EDC: 10-30-20 ALLERGIES: PENICILLINS;  Surgeon: Marcelle Overlie, MD;  Location: [facility];  Service: Obstetrics;  Laterality: N/A;   wisdom teeth removal      Prior to Admission medications   Medication Sig Start Date End Date Taking? Authorizing Provider  Ascorbic Acid (VITAMIN C WITH ROSE HIPS) 500 MG tablet Take 500 mg by mouth daily.   Yes [provider]  atorvastatin (LIPITOR) 40 MG tablet TAKE 1 TABLET BY MOUTH EVERY DAY 07/08/22  Yes Mliss Sax, MD  cholecalciferol (VITAMIN D3) 25 MCG (1000 UNIT) tablet Take 2,000 Units by mouth daily.   Yes [provider]  Multiple Vitamin (MULTIVITAMIN) capsule Take 1 capsule by mouth daily. 04/01/19  Yes [provider]  Cholecalciferol 25 MCG (1000 UT) capsule 3000 units by oral route. 12/29/20   [provider]  HAILEY 24  FE 1-20 MG-MCG(24) tablet Take 1 tablet by mouth daily. Patient not taking: Reported on 06/10/2023 12/17/22   [provider]  ibuprofen (ADVIL) 800 MG tablet Take 1 tablet (800 mg total) by mouth every 8 (eight) hours. 10/28/20   Lyn Henri, MD  Omega-3 Fatty Acids (FISH OIL) 1000 MG CAPS Take by mouth. 05/20/19   [provider]  Probiotic Product (PROBIOTIC BLEND PO)  Take by mouth.    [provider]  SUMAtriptan (IMITREX) 50 MG tablet Take 1 tablet (50 mg total) by mouth every 2 (two) hours as needed for migraine. May repeat in 2 hours if headache persists or recurs. No more than 2 each day. 12/24/21   Mliss Sax, MD    Current Outpatient Medications  Medication Sig Dispense Refill   Ascorbic Acid (VITAMIN C WITH ROSE HIPS) 500 MG tablet Take 500 mg by mouth daily.     atorvastatin (LIPITOR) 40 MG tablet TAKE 1 TABLET BY MOUTH EVERY DAY 90 tablet 3   cholecalciferol (VITAMIN D3) 25 MCG (1000 UNIT) tablet Take 2,000 Units by mouth daily.     Multiple Vitamin (MULTIVITAMIN) capsule Take 1 capsule by mouth daily.     Cholecalciferol 25 MCG (1000 UT) capsule 3000 units by oral route.     HAILEY 24 FE 1-20 MG-MCG(24) tablet Take 1 tablet by mouth daily. (Patient not taking: Reported on 06/10/2023)     ibuprofen (ADVIL) 800 MG tablet Take 1 tablet (800 mg total) by mouth every 8 (eight) hours. 30 tablet 0   Omega-3 Fatty Acids (FISH OIL) 1000 MG CAPS Take by mouth.     Probiotic Product (PROBIOTIC BLEND PO) Take by mouth.     SUMAtriptan (IMITREX) 50 MG tablet Take 1 tablet (50 mg total) by mouth every 2 (two) hours as needed for migraine. May repeat in 2 hours if headache persists or recurs. No more than 2 each day. 10 tablet 0   Current Facility-Administered Medications  Medication Dose Route Frequency Provider Last Rate Last Admin   0.9 %  sodium chloride infusion  500 mL Intravenous Once Imogene Burn, MD        Allergies as of 06/27/2023 - Review Complete 06/27/2023  Allergen Reaction Noted   Other Itching 03/26/2021   Penicillins Rash and Other (See Comments) 03/11/2012    Family History  Problem Relation Age of Onset   Hyperlipidemia Mother    Diabetes Mother    Arthritis Mother    Cataracts Mother    Alcohol abuse Father    Stroke Father    Emphysema Father    Hyperlipidemia Sister    Anemia Sister    Hyperlipidemia  Sister    Anemia Sister    Cancer Maternal Grandfather    Colon cancer Neg Hx    Colon polyps Neg Hx    Esophageal cancer Neg Hx    Stomach cancer Neg Hx    Rectal cancer Neg Hx     Social History   Socioeconomic History   Marital status: Married    Spouse name: Not on file   Number of children: Not on file   Years of education: Not on file   Highest education level: Not on file  Occupational History   Not on file  Tobacco Use   Smoking status: Never   Smokeless tobacco: Never  Vaping Use   Vaping status: Never Used  Substance and Sexual Activity   Alcohol use: No   Drug use: No  Sexual activity: Yes    Birth control/protection: Condom  Other Topics Concern   Not on file  Social History Narrative   Not on file   Social Determinants of Health   Financial Resource Strain: Not on file  Food Insecurity: Not on file  Transportation Needs: Not on file  Physical Activity: Not on file  Stress: Not on file  Social Connections: Unknown (12/14/2021)   Received from [facility], [facility]   Social Network    Social Network: Not on file  Intimate Partner Violence: Unknown (11/16/2021)   [facility] from [facility], [facility]   HITS    Physically Hurt: Not on file    Insult or Talk Down To: Not on file    Threaten Physical Harm: Not on file    Scream or Curse: Not on file    Physical Exam: Vital signs in last 24 hours: BP 124/80   Pulse 80   Temp 97.7 F (36.5 C)   Ht 5\' 6"  (1.676 m)   Wt 165 lb (74.8 kg)   SpO2 100%   BMI 26.63 kg/m  GEN: NAD EYE: Sclerae anicteric ENT: MMM CV: Non-tachycardic Pulm: No increased work of breathing GI: Soft, NT/ND NEURO:  Alert & Oriented   Eulah Pont, MD [facility]  06/27/2023 9:11 AM

## 2023-06-27 NOTE — Patient Instructions (Signed)
-   Discharge patient to home (with escort). - Await pathology results. - The findings and recommendations were discussed with the patient.   YOU HAD AN ENDOSCOPIC PROCEDURE TODAY AT THE [facility]:   Refer to the procedure report that was given to you for any specific questions about what was found during the examination.  If the procedure report does not answer your questions, please call your gastroenterologist to clarify.  If you requested that your care partner not be given the details of your procedure findings, then the procedure report has been included in a sealed envelope for you to review at your convenience later.  YOU SHOULD EXPECT: Some feelings of bloating in the abdomen. Passage of more gas than usual.  Walking can help get rid of the air that was put into your GI tract during the procedure and reduce the bloating. If you had a lower endoscopy (such as a colonoscopy or flexible sigmoidoscopy) you may notice spotting of blood in your stool or on the toilet paper. If you underwent a bowel prep for your procedure, you may not have a normal bowel movement for a few days.  Please Note:  You might notice some irritation and congestion in your nose or some drainage.  This is from the oxygen used during your procedure.  There is no need for concern and it should clear up in a day or so.  SYMPTOMS TO REPORT IMMEDIATELY:  Following lower endoscopy (colonoscopy or flexible sigmoidoscopy):  Excessive amounts of blood in the stool  Significant tenderness or worsening of abdominal pains  Swelling of the abdomen that is new, acute  Fever of 100F or higher  For urgent or emergent issues, a gastroenterologist can be reached at any hour by calling [phone]. Do not use MyChart messaging for urgent concerns.    DIET:  We do recommend a small meal at first, but then you may proceed to your regular diet.  Drink plenty of fluids but you should avoid alcoholic beverages for 24  hours.  ACTIVITY:  You should plan to take it easy for the rest of today and you should NOT DRIVE or use heavy machinery until tomorrow (because of the sedation medicines used during the test).    FOLLOW UP: Our staff will call the number listed on your records the next business day following your procedure.  We will call around 7:15- 8:00 am to check on you and address any questions or concerns that you may have regarding the information given to you following your procedure. If we do not reach you, we will leave a message.     If any biopsies were taken you will be contacted by phone or by letter within the next 1-3 weeks.  Please call us at [phone] if you have not heard about the biopsies in 3 weeks.    SIGNATURES/CONFIDENTIALITY: You and/or your care partner have signed paperwork which will be entered into your electronic medical record.  These signatures attest to the fact that that the information above on your After Visit Summary has been reviewed and is understood.  Full responsibility of the confidentiality of this discharge information lies with you and/or your care-partner.

## 2023-06-27 NOTE — Progress Notes (Signed)
Called to room to assist during endoscopic procedure.  Patient ID and intended procedure confirmed with present staff. Received instructions for my participation in the procedure from the performing physician.  

## 2023-06-30 ENCOUNTER — Telehealth: Payer: Self-pay

## 2023-06-30 NOTE — Telephone Encounter (Signed)
  Follow up Call-     06/27/2023    8:35 AM  Call back number  Post procedure Call Back phone  # [phone]  Permission to leave phone message Yes     Patient questions:  Do you have a fever, pain , or abdominal swelling? No  Pain Score  0 *  Have you tolerated food without any problems? Yes.    Have you been able to return to your normal activities? Yes.    Do you have any questions about your discharge instructions: Diet   No. Medications  No. Follow up visit  No.  Do you have questions or concerns about your Care? No.  Actions: * If pain score is 4 or above: No action needed, pain <4.

## 2023-07-01 ENCOUNTER — Encounter: Payer: Self-pay | Admitting: Internal Medicine

## 2023-07-01 LAB — SURGICAL PATHOLOGY

## 2023-07-25 ENCOUNTER — Other Ambulatory Visit: Payer: Self-pay | Admitting: Family Medicine

## 2023-07-25 DIAGNOSIS — E7800 Pure hypercholesterolemia, unspecified: Secondary | ICD-10-CM

## 2023-10-10 ENCOUNTER — Ambulatory Visit: Payer: Medicaid Other | Admitting: Nurse Practitioner

## 2023-10-10 ENCOUNTER — Encounter: Payer: Self-pay | Admitting: Nurse Practitioner

## 2023-10-10 VITALS — BP 118/66 | HR 100 | Temp 98.1°F | Ht 66.5 in | Wt 170.6 lb

## 2023-10-10 DIAGNOSIS — J069 Acute upper respiratory infection, unspecified: Secondary | ICD-10-CM | POA: Diagnosis not present

## 2023-10-10 NOTE — Progress Notes (Signed)
 Acute Office Visit  Subjective:    Patient ID: Cathy Hamilton, female    DOB: [birth year], 48 y.o.   MRN: [id number]  Chief Complaint  Patient presents with   Multiple symptoms     Started 1 week ago sore throat, nasal and chest congestion yellow/green mucus, and cough     URI  This is a new problem. The current episode started in the past 7 days. The problem has been unchanged. There has been no fever. Associated symptoms include congestion, coughing, headaches, rhinorrhea, sinus pain and a sore throat. Pertinent negatives include no abdominal pain, chest pain, diarrhea, dysuria, ear pain, joint pain, joint swelling, nausea, neck pain, plugged ear sensation, rash, sneezing, swollen glands, vomiting or wheezing. She has tried decongestant and increased fluids for the symptoms. The treatment provided no relief.   Outpatient Medications Prior to Visit  Medication Sig   Ascorbic Acid (VITAMIN C WITH ROSE HIPS) 500 MG tablet Take 500 mg by mouth daily.   atorvastatin (LIPITOR) 40 MG tablet TAKE 1 TABLET BY MOUTH EVERY DAY   cholecalciferol (VITAMIN D3) 25 MCG (1000 UNIT) tablet Take 2,000 Units by mouth daily.   Cholecalciferol 25 MCG (1000 UT) capsule 3000 units by oral route.   HAILEY 24 FE 1-20 MG-MCG(24) tablet Take 1 tablet by mouth daily.   ibuprofen (ADVIL) 800 MG tablet Take 1 tablet (800 mg total) by mouth every 8 (eight) hours.   Multiple Vitamin (MULTIVITAMIN) capsule Take 1 capsule by mouth daily.   Omega-3 Fatty Acids (FISH OIL) 1000 MG CAPS Take by mouth.   Probiotic Product (PROBIOTIC BLEND PO) Take by mouth.   SUMAtriptan (IMITREX) 50 MG tablet Take 1 tablet (50 mg total) by mouth every 2 (two) hours as needed for migraine. May repeat in 2 hours if headache persists or recurs. No more than 2 each day.   No facility-administered medications prior to visit.    Reviewed past medical and social history.  Review of Systems  HENT:  Positive for congestion, rhinorrhea,  sinus pain and sore throat. Negative for ear pain and sneezing.   Respiratory:  Positive for cough. Negative for wheezing.   Cardiovascular:  Negative for chest pain.  Gastrointestinal:  Negative for abdominal pain, diarrhea, nausea and vomiting.  Genitourinary:  Negative for dysuria.  Musculoskeletal:  Negative for joint pain and neck pain.  Skin:  Negative for rash.  Neurological:  Positive for headaches.   Per HPI     Objective:    Physical Exam Vitals and nursing note reviewed.  Constitutional:      General: She is not in acute distress. HENT:     Right Ear: Tympanic membrane, ear canal and external ear normal.     Left Ear: Tympanic membrane, ear canal and external ear normal.     Nose: Mucosal edema, congestion and rhinorrhea present. No nasal tenderness.     Right Nostril: No occlusion.     Left Nostril: No occlusion.     Right Turbinates: Enlarged and swollen. Not pale.     Left Turbinates: Enlarged and swollen. Not pale.     Right Sinus: No maxillary sinus tenderness or frontal sinus tenderness.     Left Sinus: No maxillary sinus tenderness or frontal sinus tenderness.     Mouth/Throat:     Pharynx: Oropharynx is clear. Uvula midline. Posterior oropharyngeal erythema and postnasal drip present. No pharyngeal swelling, oropharyngeal exudate or uvula swelling.     Tonsils: No tonsillar exudate or tonsillar abscesses.  Eyes:     Extraocular Movements: Extraocular movements intact.     Conjunctiva/sclera: Conjunctivae normal.  Cardiovascular:     Rate and Rhythm: Normal rate and regular rhythm.     Pulses: Normal pulses.     Heart sounds: Normal heart sounds.  Pulmonary:     Effort: Pulmonary effort is normal.     Breath sounds: Normal breath sounds.  Musculoskeletal:     Cervical back: Normal range of motion and neck supple.  Lymphadenopathy:     Cervical: No cervical adenopathy.  Neurological:     Mental Status: She is alert and oriented to person, place, and time.      BP 118/66 (BP Location: Left Arm, Patient Position: Sitting, Cuff Size: Normal)   Pulse 100   Temp 98.1 F (36.7 C) (Temporal)   Ht 5' 6.5" (1.689 m)   Wt 170 lb 9.6 oz (77.4 kg)   LMP 09/23/2023   SpO2 98%   Breastfeeding No   BMI 27.12 kg/m    No results found for any visits on 10/10/23.     Assessment & Plan:   Problem List Items Addressed This Visit   None Visit Diagnoses       Viral upper respiratory tract infection    -  Primary      URI Instructions: Flonase/nasocort and Afrin use: apply 1spray of afrin in each nare, wait , then apply 2sprays of flonase/nasocort in each nare. Use both nasal spray consecutively x 3days, then flonase/nasocort only for at least 14days. Encourage adequate oral hydration. Use mucinex for sinus congestion  You can use plain "Tylenol" or "Advil" for fever, chills and achyness. Use cool mist humidifier at bedtime to help with nasal congestion and cough.  "Common cold" symptoms are usually triggered by a virus.  The antibiotics are usually not necessary. On average, a" viral cold" illness may take 7-14 days to resolve.  Send mychart appointment if no improvement in 1week.  No orders of the defined types were placed in this encounter.  No follow-ups on file.  Alysia Penna, NP

## 2023-10-10 NOTE — Patient Instructions (Signed)
 URI Instructions: Flonase/nasocort and Afrin use: apply 1spray of afrin in each nare, wait , then apply 2sprays of flonase/nasocort in each nare. Use both nasal spray consecutively x 3days, then flonase/nasocort only for at least 14days. Encourage adequate oral hydration. Use mucinex for sinus congestion  You can use plain "Tylenol" or "Advil" for fever, chills and achyness. Use cool mist humidifier at bedtime to help with nasal congestion and cough.   "Common cold" symptoms are usually triggered by a virus.  The antibiotics are usually not necessary. On average, a" viral cold" illness may take 7-14 days to resolve.  Send mychart appointment if no improvement in 1week.

## 2023-11-18 ENCOUNTER — Ambulatory Visit: Payer: Self-pay

## 2023-11-18 NOTE — Telephone Encounter (Signed)
 Chief Complaint: tick bite Symptoms: 1/2 inch of redness, swelling that is improving Frequency: tick noticed this AM, has been removed Pertinent Negatives: Patient denies fever, spreading redness, sore throat, muscle stiffness, N/V, headache Disposition: [] ED /[] Urgent Care (no appt availability in office) / [] Appointment(In office/virtual)/ []  [facility]/ [x] Home Care/ [] Refused Recommended Disposition /[] [facility]/ []  Follow-up with PCP Additional Notes: Pt reports she noticed a tick on the back of her knee this AM the size of an appleseed. Pt states the tick was embedded in the skin and she removed the tick with tweezers. Pt believes she removed the entire tick as she cannot see any remaining tick tissue in her skin. Pt states there was a bit of swelling at the site but it has improved. Pt endorses a bit of redness there as well, about 1/2 inch in size. Denies spreading redness, fever, headache, sore throat, muscle aches, N/V, pus, drainage, infection. RN gave home care advice for ticks and asked pt to monitor for worsening.  Pt also reports she got food poisoning after eating a salad 3 weeks ago. Pt states she began vomiting after eating the salad and had diarrhea the next day. Pt states she lost 4-5 lbs. Pt states she is drinking normally at this time and has no signs of dehydration. Pt states her appetite has returned but she is very cautious and careful with what she eats and pays close attention to expiration dates especially for lettuce. RN advised pt if she loses her appetite, develops dizziness, weakness, dark urine, nausea, diarrhea, vomiting, fever, to give [place] a call. Pt verbalized understanding.    Copied From CRM [id number]. Reason for Triage: Patient requesting call from nurse regarding tick bite - it's red and swellen went down. Also wants to discuss vomiting from stomach bug 3 weeks ago. Please call [phone] ([phone]  Reason for Disposition  Unknown type of tick  bite with no complications  Answer Assessment - Initial Assessment Questions 1. ATTACHED:  "Is the tick still on the skin?"  (e.g., yes, no, unsure)     No 2. ONSET - TICK STILL ATTACHED:  "How long do you think the tick has been on your skin?" (e.g., hours, days, unsure)  Note:  Is there a recent activity (camping, hiking) where the caller may have been exposed?     Not is not still attached 3. ONSET - TICK NOT STILL ATTACHED: "If the tick has been removed, how long do you think the tick was attached before you removed it?" (e.g., 5 hours, 2 days). "When was this?"     Pt removed a tick this AM and states it was "embedded" like it had been there a while, pt states she did some gardening yesterday as she does every day. Pt states it was a Lone star tick. Pt removed it with tweezers and is "pretty sure" she got all of it, pt can't see anything in the skin. Pt had to tug on it 3x to get it.  4. LOCATION: "Where is the tick bite located?" (e.g., arm, leg)     "A little away from the fold behind my R knee" 5. TYPE of TICK: "Is it a wood tick or a deer tick?" (e.g., deer tick, wood tick; unsure)     Lone star  6. SIZE of TICK: "How big is the tick?" (e.g., size of poppy seed, apple seed, watermelon seed; unsure) Note: Deer ticks can be the size of a poppy seed (nymph) or an  apple seed (adult).       Apple seed, maybe smaller  7. ENGORGED: "Did the tick look flat or engorged (full, swollen)?" (e.g., flat, engorged; unsure)     Flat  8. OTHER SYMPTOMS: "Do you have any other symptoms?" (e.g., fever, rash, redness at bite area, red ring around bite)     Pt states there is some swelling and redness. States swelling has gone down. States redness is not spreading, looks better than it was this AM. No red ring  3 wks ago she had a salad and coffee and then began vomiting over and over with diarrhea the next day. Pt states she "couldn't eat much", just drank water and Gatorade the next day. Pt states she  started eating applesauce and is back to eating normal foods. Pt states she does not want to eat nuts anymore. Pt endorses weight loss with illness. Pt states she lost 4-5 lbs. Able to drink fluids. Denies symptoms of dehydration. Pt states she has "always been a little selective" with  what she eats, tedious about expiration dates. Pt states she had a fever briefly one of the days she was sick but none anymore.  Protocols used: Tick Bite-A-AH

## 2023-11-18 NOTE — Progress Notes (Unsigned)
 [facility] [address] [place] [address] Dept: [phone] Dept Fax: [phone]  Acute Care Office Visit  Subjective:   Cathy Hamilton Dec 05, 1975 11/19/2023  No chief complaint on file.   HPI: Discussed the use of AI scribe software for clinical note transcription with the patient, who gave verbal consent to proceed.  History of Present Illness      The following portions of the patient's history were reviewed and updated as appropriate: past medical history, past surgical history, family history, social history, allergies, medications, and problem list.   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   Thoracic myofascial strain 12/20/2020   Strain of calf muscle 12/13/2020   Previous cesarean section 10/25/2020   Ear congestion 12/27/2019   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   Muscle fasciculation 11/23/2019   Healthcare maintenance 11/23/2019   Cervical radiculopathy 08/16/2019   Lumbar radiculopathy 08/16/2019   Neck pain 04/01/2019   Jaw pain 04/01/2019   Chest wall pain 09/11/2018   Submandibular gland swelling 01/23/2018   Acute headache 01/10/2017   Chronic abdominal pain 01/10/2017   Elevated LDL cholesterol level 10/03/2012   Elevated cholesterol 10/03/2012   Previous cesarean delivery affecting pregnancy 03/21/2012    Class: Present on Admission   S/P cesarean section 03/21/2012    Class: Status post   Past Medical  History:  Diagnosis Date   Acute headache 01/10/2017   Allergy    Cervical radiculopathy 08/16/2019   Chest wall pain 09/11/2018   Chronic abdominal pain 01/10/2017   Congestion of upper airway 06/06/2021   Ear congestion 12/27/2019   Elevated cholesterol 10/03/2012   Formatting of this note might be different from the original. Overview:  10 year risk for heart disease less than 3%   Elevated LDL cholesterol level 10/03/2012   10 year risk for heart disease less than 3%   GERD (gastroesophageal reflux disease)    with pregnancy-no meds   Healthcare maintenance 11/23/2019   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   Hyperlipidemia    Jaw pain 04/01/2019   Lumbar radiculopathy 08/16/2019   Migraine without aura and without status migrainosus, not intractable 06/28/2021   Muscle fasciculation 11/23/2019   Neck pain 04/01/2019   Formatting of this note might be different from the original. USG right neck negative.  Last Assessment & Plan:  Formatting of this note might be different from the original. Concern about her neck. Chronic history of intermittent discomfort in the primarily right side of her neck.  She has been doing some physical therapy herself along with wearing a night brace for TMJ and does feel symptoms are   Post-nasal drip 06/06/2021   Previous cesarean delivery affecting pregnancy 03/21/2012   Previous cesarean section 10/25/2020   S/P cesarean section 03/21/2012   Strain of calf muscle 12/13/2020   Submandibular gland swelling 01/23/2018   Thoracic myofascial strain 12/20/2020   Vitamin D deficiency 06/28/2021   Past Surgical History:  Procedure Laterality Date   CESAREAN SECTION  2009, 2011   CESAREAN SECTION  03/20/2012   Procedure: CESAREAN SECTION;  Surgeon: Juluis Mire, MD;  Location: [facility];  Service: Gynecology;  Laterality: N/A;   CESAREAN SECTION N/A 10/25/2020   Procedure: REPEAT CESAREAN SECTION PREVIOUS X 3  EDC: 10-30-20 ALLERGIES: PENICILLINS;  Surgeon:  Marcelle Overlie, MD;  Location: [facility];  Service: Obstetrics;  Laterality: N/A;   wisdom teeth removal     Family History  Problem Relation Age of Onset   Hyperlipidemia Mother    Diabetes Mother    Arthritis Mother    Cataracts Mother    Alcohol abuse Father    Stroke Father    Emphysema Father    Hyperlipidemia Sister    Anemia Sister    Hyperlipidemia Sister    Anemia Sister    Cancer Maternal Grandfather    Colon cancer Neg Hx    Colon polyps Neg Hx    Esophageal cancer Neg Hx    Stomach cancer Neg Hx    Rectal cancer Neg Hx     Current Outpatient Medications:    Ascorbic Acid (VITAMIN C WITH ROSE HIPS) 500 MG tablet, Take 500 mg by mouth daily., Disp: , Rfl:    atorvastatin (LIPITOR) 40 MG tablet, TAKE 1 TABLET BY MOUTH EVERY DAY, Disp: 90 tablet, Rfl: 3   cholecalciferol (VITAMIN D3) 25 MCG (1000 UNIT) tablet, Take 2,000 Units by mouth daily., Disp: , Rfl:    Cholecalciferol 25 MCG (1000 UT) capsule, 3000 units by oral route., Disp: , Rfl:    HAILEY 24 FE 1-20 MG-MCG(24) tablet, Take 1 tablet by mouth daily., Disp: , Rfl:    ibuprofen (ADVIL) 800 MG tablet, Take 1 tablet (800 mg total) by mouth every 8 (eight) hours., Disp: 30 tablet, Rfl: 0   Multiple Vitamin (MULTIVITAMIN) capsule, Take 1 capsule by mouth daily., Disp: , Rfl:    Omega-3 Fatty Acids (FISH OIL) 1000 MG CAPS, Take by mouth., Disp: , Rfl:    Probiotic Product (PROBIOTIC BLEND PO), Take by mouth., Disp: , Rfl:    SUMAtriptan (IMITREX) 50 MG tablet, Take 1 tablet (50 mg total) by mouth every 2 (two) hours as needed for migraine. May repeat in 2 hours if headache persists or recurs. No more than 2 each day., Disp: 10 tablet, Rfl: 0 Allergies  Allergen Reactions   Other Itching    "Tumeric causes my lips to itch and break out".    Penicillins Rash and Other (See Comments)    Childhood reaction     ROS: A complete ROS was performed with pertinent positives/negatives noted in the HPI. The remainder of  the ROS are negative.    Objective:   There were no vitals filed for this visit.  GENERAL: Well-appearing, in NAD. Well nourished.  SKIN: Pink, warm and dry. No rash, lesion, ulceration, or ecchymoses.  NECK: Trachea midline. Full ROM w/o pain or tenderness. No lymphadenopathy.  RESPIRATORY: Chest wall symmetrical. Respirations even and non-labored. Breath sounds clear to auscultation bilaterally.  CARDIAC: S1, S2 present, regular rate and rhythm. Peripheral pulses 2+ bilaterally.  MSK: Muscle tone and strength appropriate for age. Joints w/o tenderness, redness, or swelling. EXTREMITIES: Without clubbing, cyanosis, or edema.  NEUROLOGIC: No motor or sensory deficits. Steady, even gait.  PSYCH/MENTAL STATUS: Alert, oriented x 3. Cooperative, appropriate mood and affect.    No results found for any visits on 11/19/23.    Assessment & Plan:  Assessment and Plan Assessment & Plan       There are no diagnoses  linked to this encounter. No orders of the defined types were placed in this encounter.  No orders of the defined types were placed in this encounter.  Lab Orders  No laboratory test(s) ordered today   No images are attached to the encounter or orders placed in the encounter.  No follow-ups on file.   Salvatore Decent, FNP

## 2023-11-19 ENCOUNTER — Encounter: Payer: Self-pay | Admitting: Internal Medicine

## 2023-11-19 ENCOUNTER — Ambulatory Visit: Admitting: Internal Medicine

## 2023-11-19 VITALS — BP 114/73 | HR 65 | Temp 97.5°F | Resp 18 | Wt 165.4 lb

## 2023-11-19 DIAGNOSIS — W57XXXA Bitten or stung by nonvenomous insect and other nonvenomous arthropods, initial encounter: Secondary | ICD-10-CM

## 2023-11-19 DIAGNOSIS — S80261A Insect bite (nonvenomous), right knee, initial encounter: Secondary | ICD-10-CM

## 2023-11-19 MED ORDER — DOXYCYCLINE HYCLATE 100 MG PO TABS: 100.0000 mg | ORAL_TABLET | Freq: Two times a day (BID) | ORAL | 0 refills | Status: AC

## 2023-12-02 ENCOUNTER — Ambulatory Visit: Admitting: Internal Medicine

## 2023-12-02 ENCOUNTER — Encounter: Payer: Self-pay | Admitting: Internal Medicine

## 2023-12-02 VITALS — BP 110/70 | HR 91 | Temp 97.9°F | Ht 66.5 in | Wt 166.4 lb

## 2023-12-02 DIAGNOSIS — S80261D Insect bite (nonvenomous), right knee, subsequent encounter: Secondary | ICD-10-CM | POA: Diagnosis not present

## 2023-12-02 DIAGNOSIS — W57XXXD Bitten or stung by nonvenomous insect and other nonvenomous arthropods, subsequent encounter: Secondary | ICD-10-CM

## 2023-12-02 NOTE — Progress Notes (Unsigned)
 [facility] [address] [place] [address] Dept: [phone] Dept Fax: [phone]  Acute Care Office Visit  Subjective:   Cathy Hamilton Dec 12, 1975 12/02/2023  Chief Complaint  Patient presents with   Follow-up    Tick bite     HPI: Discussed the use of AI scribe software for clinical note transcription with the patient, who gave verbal consent to proceed.  History of Present Illness   Cathy Hamilton is a 48 year old female who presents with a persistent reaction from a tick bite.  She was seen by primary care on 11/19/23 for tick bite, was given doxycycline  100mg  BID x 10 days, which she completed.  Initially area was very itchy. After washing the area, she may have rubbed the scab off, resulting in a small amount of pus drainage. The area remains tender to touch.  She has a history of significant reactions to insect bites, including large bumps from mosquito bites that take a while to resolve. The redness has resolved, but she is concerned about the possibility of granulomas.  She has been applying Neosporin and occasionally cleaning the area with alcohol. She is vigilant about monitoring for symptoms such as fever, flu-like symptoms, joint pain, or a rash around the area, which have not occurred.  She uses insect repellent containing DEET to prevent further bites and is diligent about checking for ticks after being outdoors. No fever, flu-like symptoms, joint pain, or rash around the bite area.        The following portions of the patient's history were reviewed and updated as appropriate: past medical history, past surgical history, family history, social history, allergies, medications, and problem list.   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   Thoracic myofascial strain 12/20/2020   Strain of calf muscle 12/13/2020   Previous cesarean section 10/25/2020   Ear congestion 12/27/2019   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   Muscle fasciculation 11/23/2019   Healthcare maintenance 11/23/2019   Cervical radiculopathy 08/16/2019   Lumbar radiculopathy 08/16/2019   Neck pain 04/01/2019   Jaw pain 04/01/2019   Chest wall pain 09/11/2018   Submandibular gland swelling 01/23/2018   Acute headache 01/10/2017   Chronic abdominal pain 01/10/2017   Elevated LDL cholesterol level 10/03/2012   Elevated cholesterol 10/03/2012   Previous cesarean delivery affecting pregnancy 03/21/2012    Class: Present on Admission   S/P cesarean section 03/21/2012    Class: Status post   Past Medical History:  Diagnosis Date   Acute headache 01/10/2017   Allergy    Cervical radiculopathy 08/16/2019   Chest wall pain 09/11/2018   Chronic abdominal pain 01/10/2017   Congestion of upper airway 06/06/2021   Ear congestion 12/27/2019   Elevated cholesterol 10/03/2012   Formatting of this note might be different from the original. 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 USG right neck negative.  Last Assessment & Plan:  Formatting of this note might be different from the original. Concern about her neck. Chronic history of intermittent discomfort in the primarily right side of her neck.  She has been doing some physical therapy herself along with wearing a night brace for TMJ and does feel symptoms are   Post-nasal drip 06/06/2021   Previous cesarean delivery affecting pregnancy 03/21/2012   Previous cesarean section 10/25/2020   S/P cesarean section 03/21/2012   Strain of calf muscle 12/13/2020   Submandibular gland swelling 01/23/2018   Thoracic myofascial strain 12/20/2020   Vitamin D  deficiency 06/28/2021   Past Surgical History:  Procedure Laterality Date   CESAREAN SECTION  2009, 2011   CESAREAN SECTION  03/20/2012   Procedure: CESAREAN SECTION;  Surgeon: Chandler Combs, MD;  Location: [facility];  Service: Gynecology;  Laterality: N/A;   CESAREAN SECTION N/A 10/25/2020   Procedure: REPEAT CESAREAN SECTION PREVIOUS X 3  EDC: 10-30-20 ALLERGIES: PENICILLINS;  Surgeon: Thurman Flores, MD;  Location: [facility];  Service: Obstetrics;  Laterality: N/A;   wisdom teeth removal     Family History  Problem Relation Age of Onset   Hyperlipidemia Mother    Diabetes Mother    Arthritis Mother    Cataracts Mother    Alcohol abuse Father    Stroke Father    Emphysema Father    Hyperlipidemia Sister    Anemia Sister    Hyperlipidemia Sister    Anemia Sister    Cancer Maternal Grandfather    Colon cancer Neg Hx    Colon polyps Neg Hx    Esophageal cancer Neg Hx    Stomach cancer Neg Hx    Rectal cancer Neg Hx     Current Outpatient Medications:    Ascorbic Acid (VITAMIN C WITH ROSE HIPS) 500 MG tablet, Take 500 mg by mouth daily., Disp: , Rfl:    atorvastatin  (LIPITOR) 40 MG tablet, TAKE 1 TABLET BY MOUTH EVERY DAY, Disp: 90 tablet, Rfl: 3   cholecalciferol (VITAMIN D3) 25 MCG (1000 UNIT) tablet, Take  2,000 Units by mouth daily., Disp: , Rfl:    Cholecalciferol 25 MCG (1000 UT) capsule, 3000 units by oral route., Disp: , Rfl:    HAILEY 24 FE 1-20 MG-MCG(24) tablet, Take 1 tablet by mouth daily., Disp: , Rfl:    ibuprofen  (ADVIL ) 800 MG tablet, Take 1 tablet (800 mg total) by mouth every 8 (eight) hours., Disp: 30 tablet, Rfl: 0   Multiple Vitamin (MULTIVITAMIN) capsule, Take 1 capsule by mouth daily., Disp: , Rfl:    Probiotic Product (PROBIOTIC BLEND PO), Take by mouth., Disp: , Rfl:    SUMAtriptan  (IMITREX ) 50 MG tablet, Take 1 tablet (50 mg total) by mouth every 2 (two) hours as needed for migraine. May repeat in 2 hours if headache persists or recurs. No more than 2 each day., Disp: 10 tablet, Rfl: 0   Omega-3 Fatty Acids (FISH OIL) 1000 MG CAPS, Take by mouth. (Patient not taking: Reported on 12/02/2023), Disp: , Rfl:  Allergies  Allergen Reactions   Other Itching    "  Tumeric causes my lips to itch and break out".    Penicillins Rash and Other (See Comments)    Childhood reaction     ROS: A complete ROS was performed with pertinent positives/negatives noted in the HPI. The remainder of the ROS are negative.    Objective:   Today's Vitals   12/02/23 1304  BP: 110/70  Pulse: 91  Temp: 97.9 F (36.6 C)  TempSrc: Temporal  SpO2: 99%  Weight: 166 lb 6.4 oz (75.5 kg)  Height: 5' 6.5" (1.689 m)    GENERAL: Well-appearing, in NAD. Well nourished.  SKIN: Pink, warm and dry. Single insect bite wound with mild swelling to posterior aspect of R. Knee. No redness or warmth.  RESPIRATORY: Chest wall symmetrical. Respirations even and non-labored.  CARDIAC: Peripheral pulses 2+ bilaterally.  MSK: Muscle tone and strength appropriate for age. Joints w/o tenderness, redness, or swelling. EXTREMITIES: Without clubbing, cyanosis, or edema.  NEUROLOGIC: No motor or sensory deficits. Steady, even gait.  PSYCH/MENTAL STATUS: Alert, oriented x 3. Cooperative, appropriate mood and affect.     No results found for any visits on 12/02/23.    Assessment & Plan:  Assessment and Plan    Tick Bite - Apply warm compresses. - Monitor for infection signs: increased redness, warmth, fever, drainage. - Avoid squeezing or pressing area. - Continue Neosporin if desired. - Use DEET-containing insect repellent. - Perform regular tick checks after outdoor activities.       No orders of the defined types were placed in this encounter.  No orders of the defined types were placed in this encounter.  Lab Orders  No laboratory test(s) ordered today   No images are attached to the encounter or orders placed in the encounter.  Return if symptoms worsen or fail to improve.   Gavin Kast, FNP

## 2024-01-18 ENCOUNTER — Ambulatory Visit
Admission: EM | Admit: 2024-01-18 | Discharge: 2024-01-18 | Disposition: A | Discharge status: Home / Self Care | Attending: Family Medicine | Admitting: Family Medicine

## 2024-01-18 DIAGNOSIS — W57XXXA Bitten or stung by nonvenomous insect and other nonvenomous arthropods, initial encounter: Secondary | ICD-10-CM | POA: Diagnosis not present

## 2024-01-18 DIAGNOSIS — L249 Irritant contact dermatitis, unspecified cause: Secondary | ICD-10-CM

## 2024-01-18 MED ORDER — TRIAMCINOLONE ACETONIDE 0.1 % EX CREA: 1.0000 | TOPICAL_CREAM | Freq: Two times a day (BID) | CUTANEOUS | 0 refills | Status: AC

## 2024-01-18 NOTE — ED Triage Notes (Signed)
 Pt removed tick from left leg 3 days ago-slight bump/redness-NAD-steady gait

## 2024-01-18 NOTE — ED Provider Notes (Signed)
 [facility]  Note:  This document was prepared using Conservation officer, historic buildings and may include unintentional dictation errors.  MRN: [id number] DOB: 1976/07/02  Subjective:   Cathy Hamilton is a 48 y.o. female presenting for 3-day history of persistent left leg skin irritation.  Patient removed a tick from that area.  No drainage of pus, bleeding, fever, rash.  No current facility-administered medications for this encounter.  Current Outpatient Medications:    Ascorbic Acid (VITAMIN C WITH ROSE HIPS) 500 MG tablet, Take 500 mg by mouth daily., Disp: , Rfl:    atorvastatin  (LIPITOR) 40 MG tablet, TAKE 1 TABLET BY MOUTH EVERY DAY, Disp: 90 tablet, Rfl: 3   cholecalciferol (VITAMIN D3) 25 MCG (1000 UNIT) tablet, Take 2,000 Units by mouth daily., Disp: , Rfl:    Cholecalciferol 25 MCG (1000 UT) capsule, 3000 units by oral route., Disp: , Rfl:    HAILEY 24 FE 1-20 MG-MCG(24) tablet, Take 1 tablet by mouth daily., Disp: , Rfl:    ibuprofen  (ADVIL ) 800 MG tablet, Take 1 tablet (800 mg total) by mouth every 8 (eight) hours., Disp: 30 tablet, Rfl: 0   Multiple Vitamin (MULTIVITAMIN) capsule, Take 1 capsule by mouth daily., Disp: , Rfl:    Omega-3 Fatty Acids (FISH OIL) 1000 MG CAPS, Take by mouth. (Patient not taking: Reported on 12/02/2023), Disp: , Rfl:    Probiotic Product (PROBIOTIC BLEND PO), Take by mouth., Disp: , Rfl:    SUMAtriptan  (IMITREX ) 50 MG tablet, Take 1 tablet (50 mg total) by mouth every 2 (two) hours as needed for migraine. May repeat in 2 hours if headache persists or recurs. No more than 2 each day., Disp: 10 tablet, Rfl: 0   Allergies  Allergen Reactions   Other Itching    "Tumeric causes my lips to itch and break out".    Penicillins Rash and Other (See Comments)    Childhood reaction    Past Medical History:  Diagnosis Date   Acute headache 01/10/2017   Allergy    Cervical radiculopathy 08/16/2019   Chest wall pain 09/11/2018    Chronic abdominal pain 01/10/2017   Congestion of upper airway 06/06/2021   Ear congestion 12/27/2019   Elevated cholesterol 10/03/2012   Formatting of this note might be different from the original. Overview:  10 year risk for heart disease less than 3%   Elevated LDL cholesterol level 10/03/2012   10 year risk for heart disease less than 3%   GERD (gastroesophageal reflux disease)    with pregnancy-no meds   Healthcare maintenance 11/23/2019   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   Hyperlipidemia    Jaw pain 04/01/2019   Lumbar radiculopathy 08/16/2019   Migraine without aura and without status migrainosus, not intractable 06/28/2021   Muscle fasciculation 11/23/2019   Neck pain 04/01/2019   Formatting of this note might be different from the original. USG right neck negative.  Last Assessment & Plan:  Formatting of this note might be different from the original. Concern about her neck. Chronic history of intermittent discomfort in the primarily right side of her neck.  She has been doing some physical therapy herself along with wearing a night brace for TMJ and does feel symptoms are   Post-nasal drip 06/06/2021   Previous cesarean delivery affecting pregnancy 03/21/2012   Previous cesarean section 10/25/2020   S/P cesarean section 03/21/2012   Strain of calf muscle 12/13/2020   Submandibular gland swelling 01/23/2018   Thoracic myofascial  strain 12/20/2020   Vitamin D  deficiency 06/28/2021     Past Surgical History:  Procedure Laterality Date   CESAREAN SECTION  2009, 2011   CESAREAN SECTION  03/20/2012   Procedure: CESAREAN SECTION;  Surgeon: Chandler Combs, MD;  Location: [facility];  Service: Gynecology;  Laterality: N/A;   CESAREAN SECTION N/A 10/25/2020   Procedure: REPEAT CESAREAN SECTION PREVIOUS X 3  EDC: 10-30-20 ALLERGIES: PENICILLINS;  Surgeon: Thurman Flores, MD;  Location: [facility];  Service: Obstetrics;  Laterality: N/A;   wisdom teeth removal      Family History   Problem Relation Age of Onset   Hyperlipidemia Mother    Diabetes Mother    Arthritis Mother    Cataracts Mother    Alcohol abuse Father    Stroke Father    Emphysema Father    Hyperlipidemia Sister    Anemia Sister    Hyperlipidemia Sister    Anemia Sister    Cancer Maternal Grandfather    Colon cancer Neg Hx    Colon polyps Neg Hx    Esophageal cancer Neg Hx    Stomach cancer Neg Hx    Rectal cancer Neg Hx     Social History   Tobacco Use   Smoking status: Never   Smokeless tobacco: Never  Vaping Use   Vaping status: Never Used  Substance Use Topics   Alcohol use: No   Drug use: No    ROS   Objective:   Vitals: BP 134/84 (BP Location: Right Arm)   Pulse 86   Temp 98.1 F (36.7 C) (Oral)   Resp 16   LMP 12/30/2023   SpO2 97%   Physical Exam Constitutional:      General: She is not in acute distress.    Appearance: Normal appearance. She is well-developed. She is not ill-appearing, toxic-appearing or diaphoretic.  HENT:     Head: Normocephalic and atraumatic.     Nose: Nose normal.     Mouth/Throat:     Mouth: Mucous membranes are moist.  Eyes:     General: No scleral icterus.       Right eye: No discharge.        Left eye: No discharge.     Extraocular Movements: Extraocular movements intact.  Cardiovascular:     Rate and Rhythm: Normal rate.  Pulmonary:     Effort: Pulmonary effort is normal.  Musculoskeletal:       Legs:  Skin:    General: Skin is warm and dry.  Neurological:     General: No focal deficit present.     Mental Status: She is alert and oriented to person, place, and time.  Psychiatric:        Mood and Affect: Mood normal.        Behavior: Behavior normal.      Assessment and Plan :   PDMP not reviewed this encounter.  1. Irritant dermatitis   2. Tick bite, initial encounter    Recommended topical steroid.  No signs of secondary cellulitis.  Counseled patient on potential for adverse effects with medications  prescribed/recommended today, ER and return-to-clinic precautions discussed, patient verbalized understanding.    Adolph Hoop, [place] 01/18/24 1610

## 2024-02-24 ENCOUNTER — Ambulatory Visit: Admitting: Family Medicine

## 2024-02-24 ENCOUNTER — Encounter: Payer: Self-pay | Admitting: Family Medicine

## 2024-02-24 VITALS — BP 108/64 | HR 89 | Temp 97.6°F | Ht 66.0 in | Wt 168.6 lb

## 2024-02-24 DIAGNOSIS — D229 Melanocytic nevi, unspecified: Secondary | ICD-10-CM

## 2024-02-24 DIAGNOSIS — G43009 Migraine without aura, not intractable, without status migrainosus: Secondary | ICD-10-CM | POA: Diagnosis not present

## 2024-02-24 MED ORDER — SUMATRIPTAN SUCCINATE 50 MG PO TABS: 50.0000 mg | ORAL_TABLET | ORAL | 3 refills | Status: AC | PRN

## 2024-02-24 NOTE — Progress Notes (Signed)
 Established Patient Office Visit   Subjective:  Patient ID: Cathy Hamilton, female    DOB: 1976/01/14  Age: 48 y.o. MRN: [id number]  Chief Complaint  Patient presents with   Nevus    Mole on right calf. Pt thinks there are flat moles that she wants looked at.     HPI Encounter Diagnoses  Name Primary?   Migraine without aura and without status migrainosus, not intractable Yes   Atypical nevus    For refill of sumatriptan  that has been helpful for her migraines.  Currently her migraines are rare.  They tend to bother her when they are traveling or if she has been out in the yard working in the sun.  She has no problems taking the sumatriptan .  There is a mole on her right calf that she believes is changing.  Over the years it seems to have become raised.   Review of Systems  Constitutional: Negative.   HENT: Negative.    Eyes:  Negative for blurred vision, discharge and redness.  Respiratory: Negative.    Cardiovascular: Negative.   Gastrointestinal:  Negative for abdominal pain.  Genitourinary: Negative.   Musculoskeletal: Negative.  Negative for myalgias.  Skin:  Negative for rash.  Neurological:  Positive for headaches. Negative for tingling, loss of consciousness and weakness.  Endo/Heme/Allergies:  Negative for polydipsia.     Current Outpatient Medications:    Ascorbic Acid (VITAMIN C WITH ROSE HIPS) 500 MG tablet, Take 500 mg by mouth daily., Disp: , Rfl:    atorvastatin  (LIPITOR) 40 MG tablet, TAKE 1 TABLET BY MOUTH EVERY DAY, Disp: 90 tablet, Rfl: 3   cholecalciferol (VITAMIN D3) 25 MCG (1000 UNIT) tablet, Take 2,000 Units by mouth daily., Disp: , Rfl:    HAILEY 24 FE 1-20 MG-MCG(24) tablet, Take 1 tablet by mouth daily., Disp: , Rfl:    ibuprofen  (ADVIL ) 800 MG tablet, Take 1 tablet (800 mg total) by mouth every 8 (eight) hours., Disp: 30 tablet, Rfl: 0   Multiple Vitamin (MULTIVITAMIN) capsule, Take 1 capsule by mouth daily., Disp: , Rfl:    Omega-3 Fatty  Acids (FISH OIL) 1000 MG CAPS, Take by mouth., Disp: , Rfl:    Probiotic Product (PROBIOTIC BLEND PO), Take by mouth., Disp: , Rfl:    triamcinolone  cream (KENALOG ) 0.1 %, Apply 1 Application topically 2 (two) times daily., Disp: 30 g, Rfl: 0   Cholecalciferol 25 MCG (1000 UT) capsule, 3000 units by oral route. (Patient not taking: Reported on 02/24/2024), Disp: , Rfl:    SUMAtriptan  (IMITREX ) 50 MG tablet, Take 1 tablet (50 mg total) by mouth every 2 (two) hours as needed for migraine. May repeat in 2 hours if headache persists or recurs. No more than 2 each day., Disp: 10 tablet, Rfl: 3   Objective:     BP 108/64 (Cuff Size: Normal)   Pulse 89   Temp 97.6 F (36.4 C) (Temporal)   Ht 5' 6 (1.676 m)   Wt 168 lb 9.6 oz (76.5 kg)   SpO2 98%   BMI 27.21 kg/m    Physical Exam Constitutional:      General: She is not in acute distress.    Appearance: Normal appearance. She is not ill-appearing, toxic-appearing or diaphoretic.  HENT:     Head: Normocephalic and atraumatic.     Right Ear: External ear normal.     Left Ear: External ear normal.  Eyes:     General: No scleral icterus.  Right eye: No discharge.        Left eye: No discharge.     Extraocular Movements: Extraocular movements intact.     Conjunctiva/sclera: Conjunctivae normal.  Pulmonary:     Effort: Pulmonary effort is normal. No respiratory distress.  Skin:    General: Skin is warm and dry.      Neurological:     Mental Status: She is alert and oriented to person, place, and time.  Psychiatric:        Mood and Affect: Mood normal.        Behavior: Behavior normal.      No results found for any visits on 02/24/24.    The 10-year ASCVD risk score (Arnett DK, et al., 2019) is: 0.4%    Assessment & Plan:   Migraine without aura and without status migrainosus, not intractable -     SUMAtriptan  Succinate; Take 1 tablet (50 mg total) by mouth every 2 (two) hours as needed for migraine. May repeat in 2  hours if headache persists or recurs. No more than 2 each day.  Dispense: 10 tablet; Refill: 3  Atypical nevus -     Ambulatory referral to Dermatology    Return Follow-up in September for yearly physical, for annual physical.  Information was given on moles with a discussion about ABCDs.   Elsie Sim Lent, MD

## 2024-02-25 ENCOUNTER — Telehealth: Payer: Self-pay

## 2024-02-25 NOTE — Telephone Encounter (Signed)
 Copied from CRM [id number]. Topic: Referral - Question >> Feb 25, 2024 11:11 AM Chiquita SQUIBB wrote: Reason for CRM: Patient is calling in regarding the referral for the [facility] referral, they stated they are booked until February of next year. Patient would like a referral to a different office for a sooner visit. Please advise the patient.

## 2024-02-25 NOTE — Telephone Encounter (Signed)
 Pt asked to send MyChart message with preferred Dermatology office for location to changed.

## 2024-04-12 ENCOUNTER — Emergency Department (HOSPITAL_BASED_OUTPATIENT_CLINIC_OR_DEPARTMENT_OTHER): Admission: EM | Admit: 2024-04-12 | Discharge: 2024-04-12 | Disposition: A | Discharge status: Home / Self Care

## 2024-04-12 ENCOUNTER — Other Ambulatory Visit: Payer: Self-pay

## 2024-04-12 DIAGNOSIS — L03113 Cellulitis of right upper limb: Secondary | ICD-10-CM | POA: Insufficient documentation

## 2024-04-12 DIAGNOSIS — M79631 Pain in right forearm: Secondary | ICD-10-CM | POA: Diagnosis present

## 2024-04-12 MED ORDER — CEPHALEXIN 500 MG PO CAPS: 500.0000 mg | ORAL_CAPSULE | Freq: Four times a day (QID) | ORAL | 0 refills | Status: DC

## 2024-04-12 NOTE — ED Provider Notes (Signed)
 [place] EMERGENCY DEPARTMENT AT [facility] [place] Provider Note   CSN: [id number] Arrival date & time: 04/12/24  2123     Patient presents with: Insect Bite   Cataleah CHERILYNN SCHOMBURG is a 48 y.o. female.   This is a 48 year old female presenting emergency department with insect bite to right forearm with worsening redness.  Reports that she was bitten by she thinks a mosquito several days ago.  She notes that redness has progressively spread from where she was bitten.  No fevers no chills.  No numbness tingling or changes in sensation        Prior to Admission medications   Medication Sig Start Date End Date Taking? Authorizing Provider  Ascorbic Acid (VITAMIN C WITH ROSE HIPS) 500 MG tablet Take 500 mg by mouth daily.    [provider]  atorvastatin  (LIPITOR) 40 MG tablet TAKE 1 TABLET BY MOUTH EVERY DAY 07/25/23   Berneta Elsie Sayre, MD  cholecalciferol (VITAMIN D3) 25 MCG (1000 UNIT) tablet Take 2,000 Units by mouth daily.    [provider]  Cholecalciferol 25 MCG (1000 UT) capsule 3000 units by oral route. Patient not taking: Reported on 02/24/2024 12/29/20   [provider]  HAILEY 24 FE 1-20 MG-MCG(24) tablet Take 1 tablet by mouth daily. 12/17/22   [provider]  ibuprofen  (ADVIL ) 800 MG tablet Take 1 tablet (800 mg total) by mouth every 8 (eight) hours. 10/28/20   Lequita Evalene LABOR, MD  Multiple Vitamin (MULTIVITAMIN) capsule Take 1 capsule by mouth daily. 04/01/19   [provider]  Omega-3 Fatty Acids (FISH OIL) 1000 MG CAPS Take by mouth. 05/20/19   [provider]  Probiotic Product (PROBIOTIC BLEND PO) Take by mouth.    [provider]  SUMAtriptan  (IMITREX ) 50 MG tablet Take 1 tablet (50 mg total) by mouth every 2 (two) hours as needed for migraine. May repeat in 2 hours if headache persists or recurs. No more than 2 each day. 02/24/24   Berneta Elsie Sayre, MD  triamcinolone  cream (KENALOG ) 0.1 %  Apply 1 Application topically 2 (two) times daily. 01/18/24   Christopher Savannah, PA-C    Allergies: Other and Penicillins    Review of Systems  Updated Vital Signs BP 130/80 (BP Location: Left Arm)   Pulse 80   Temp 98.4 F (36.9 C) (Oral)   Resp 18   Ht 5' 6 (1.676 m)   Wt 74.8 kg   LMP 03/25/2024   SpO2 100%   BMI 26.63 kg/m   Physical Exam Vitals reviewed.  Constitutional:      General: She is not in acute distress.    Appearance: She is not toxic-appearing.  HENT:     Head: Normocephalic.     Nose: Nose normal.  Eyes:     Conjunctiva/sclera: Conjunctivae normal.  Cardiovascular:     Rate and Rhythm: Normal rate and regular rhythm.  Pulmonary:     Effort: Pulmonary effort is normal.  Abdominal:     General: There is no distension.  Musculoskeletal:     Comments: Right forearm with a small pustule with surrounding erythema roughly 10 cm.  No induration or fluctuance.  Bedside ultrasound with no abscess.  Neurovascularly intact, soft compartments.  2+ radial pulses.  No crepitus  Neurological:     Mental Status: She is alert and oriented to person, place, and time.  Psychiatric:        Mood and Affect: Mood normal.  Behavior: Behavior normal.     (all labs ordered are listed, but only abnormal results are displayed) Labs Reviewed - No data to display  EKG: None  Radiology: No results found.   Procedures   Medications Ordered in the ED - No data to display                                  Medical Decision Making This is a well-appearing 48 year old female presenting emergency department for evaluation of her right forearm after an insect bite.  She is afebrile nontachycardic normotensive.  Exam concerning for superficial cellulitis.  Bedside ultrasound performed by me with no underlying abscess.  Nontoxic-appearing with no fever or tachycardia low suspicion for systemic infection.  Will discharge with antibiotics.  Stable for discharge       Final  diagnoses:  None    ED Discharge Orders     None          Neysa Caron PARAS, DO 04/12/24 2257

## 2024-04-12 NOTE — Discharge Instructions (Signed)
 Please follow-up with your primary doctor.  Return for fevers, chills, redness spreads up arm, severe pain, wound begins draining pus or he develop any new or worsening symptoms that are concerning to you.  We are prescribing you antibiotics, please take them as prescribed for the full course even if your symptoms improve

## 2024-04-12 NOTE — ED Notes (Signed)
 Pt has what appears to be a later stage healing insect bite. There is not temperature to the area nor is there any streaking away from same.  Pt notes some dull pain to the area over the course of today, is also not sure what bit her.

## 2024-04-12 NOTE — ED Triage Notes (Signed)
 Insect bite several days ago. Now reports itching and burning.

## 2024-04-19 ENCOUNTER — Encounter: Admitting: Family Medicine

## 2024-05-27 ENCOUNTER — Ambulatory Visit

## 2024-05-27 ENCOUNTER — Ambulatory Visit (INDEPENDENT_AMBULATORY_CARE_PROVIDER_SITE_OTHER): Admitting: Family Medicine

## 2024-05-27 ENCOUNTER — Encounter: Payer: Self-pay | Admitting: Family Medicine

## 2024-05-27 VITALS — BP 102/64 | HR 79 | Temp 96.4°F | Ht 66.0 in | Wt 167.0 lb

## 2024-05-27 DIAGNOSIS — Z0000 Encounter for general adult medical examination without abnormal findings: Secondary | ICD-10-CM | POA: Diagnosis not present

## 2024-05-27 DIAGNOSIS — J22 Unspecified acute lower respiratory infection: Secondary | ICD-10-CM | POA: Diagnosis not present

## 2024-05-27 DIAGNOSIS — E7800 Pure hypercholesterolemia, unspecified: Secondary | ICD-10-CM

## 2024-05-27 DIAGNOSIS — Z23 Encounter for immunization: Secondary | ICD-10-CM | POA: Diagnosis not present

## 2024-05-27 DIAGNOSIS — Z131 Encounter for screening for diabetes mellitus: Secondary | ICD-10-CM

## 2024-05-27 DIAGNOSIS — R8290 Unspecified abnormal findings in urine: Secondary | ICD-10-CM

## 2024-05-27 MED ORDER — AZITHROMYCIN 250 MG PO TABS: ORAL_TABLET | ORAL | 0 refills | Status: AC

## 2024-05-27 NOTE — Progress Notes (Addendum)
 Established Patient Office Visit   Subjective:  Patient ID: Cathy Hamilton, female    DOB: Jul 17, 1976  Age: 48 y.o. MRN: [id number]  Chief Complaint  Patient presents with   Annual Exam    Physical exam. Fasting. Flu shot.    HPI Encounter Diagnoses  Name Primary?   Healthcare maintenance Yes   Flu vaccine need    Elevated LDL cholesterol level    Screening for diabetes mellitus    Lower resp. tract infection    Abnormal urinalysis    Here for physical and follow-up of above.  Ongoing cough productive of scant yellow phlegm.  No fevers or chills.  Concerned about atorvastatin  and memory loss.   Review of Systems  Constitutional: Negative.   HENT: Negative.    Eyes:  Negative for blurred vision, discharge and redness.  Respiratory:  Positive for cough and sputum production.   Cardiovascular: Negative.   Gastrointestinal:  Negative for abdominal pain.  Genitourinary: Negative.   Musculoskeletal: Negative.  Negative for myalgias.  Skin:  Negative for rash.  Neurological:  Negative for tingling, loss of consciousness and weakness.  Endo/Heme/Allergies:  Negative for polydipsia.     Current Outpatient Medications:    Ascorbic Acid (VITAMIN C WITH ROSE HIPS) 500 MG tablet, Take 500 mg by mouth daily., Disp: , Rfl:    atorvastatin  (LIPITOR) 40 MG tablet, TAKE 1 TABLET BY MOUTH EVERY DAY, Disp: 90 tablet, Rfl: 3   azithromycin  (ZITHROMAX ) 250 MG tablet, Take 2 tablets on day 1, then 1 tablet daily on days 2 through 5, Disp: 6 tablet, Rfl: 0   cholecalciferol (VITAMIN D3) 25 MCG (1000 UNIT) tablet, Take 2,000 Units by mouth daily., Disp: , Rfl:    ibuprofen  (ADVIL ) 800 MG tablet, Take 1 tablet (800 mg total) by mouth every 8 (eight) hours., Disp: 30 tablet, Rfl: 0   Multiple Vitamin (MULTIVITAMIN) capsule, Take 1 capsule by mouth daily., Disp: , Rfl:    Omega-3 Fatty Acids (FISH OIL) 1000 MG CAPS, Take by mouth., Disp: , Rfl:    Probiotic Product (PROBIOTIC BLEND PO),  Take by mouth., Disp: , Rfl:    SUMAtriptan  (IMITREX ) 50 MG tablet, Take 1 tablet (50 mg total) by mouth every 2 (two) hours as needed for migraine. May repeat in 2 hours if headache persists or recurs. No more than 2 each day., Disp: 10 tablet, Rfl: 3   triamcinolone  cream (KENALOG ) 0.1 %, Apply 1 Application topically 2 (two) times daily. (Patient not taking: Reported on 05/27/2024), Disp: 30 g, Rfl: 0   Objective:     BP 102/64 (BP Location: Left Arm, Cuff Size: Normal)   Pulse 79   Temp (!) 96.4 F (35.8 C) (Temporal)   Ht 5' 6 (1.676 m)   Wt 167 lb (75.8 kg)   SpO2 97%   BMI 26.95 kg/m    Physical Exam Constitutional:      General: She is not in acute distress.    Appearance: Normal appearance. She is not ill-appearing, toxic-appearing or diaphoretic.  HENT:     Head: Normocephalic and atraumatic.     Right Ear: Tympanic membrane, ear canal and external ear normal.     Left Ear: Tympanic membrane, ear canal and external ear normal.     Mouth/Throat:     Mouth: Mucous membranes are moist.     Pharynx: Oropharynx is clear. No oropharyngeal exudate or posterior oropharyngeal erythema.  Eyes:     General: No scleral icterus.  Right eye: No discharge.        Left eye: No discharge.     Extraocular Movements: Extraocular movements intact.     Conjunctiva/sclera: Conjunctivae normal.     Pupils: Pupils are equal, round, and reactive to light.  Cardiovascular:     Rate and Rhythm: Normal rate and regular rhythm.  Pulmonary:     Effort: Pulmonary effort is normal. No respiratory distress.     Breath sounds: Examination of the right-lower field reveals rhonchi. Examination of the left-lower field reveals rhonchi. Rhonchi present. No wheezing or rales.  Abdominal:     General: Bowel sounds are normal.     Tenderness: There is no abdominal tenderness. There is no guarding.  Musculoskeletal:     Cervical back: No rigidity or tenderness.  Skin:    General: Skin is warm and  dry.  Neurological:     Mental Status: She is alert and oriented to person, place, and time.  Psychiatric:        Mood and Affect: Mood normal.        Behavior: Behavior normal.      Results for orders placed or performed in visit on 05/27/24  CBC  Result Value Ref Range   WBC 5.6 4.0 - 10.5 K/uL   RBC 5.39 (H) 3.87 - 5.11 Mil/uL   Platelets 206.0 150.0 - 400.0 K/uL   Hemoglobin 14.5 12.0 - 15.0 g/dL   HCT 55.0 63.9 - 53.9 %   MCV 83.4 78.0 - 100.0 fl   MCHC 32.3 30.0 - 36.0 g/dL   RDW 86.2 88.4 - 84.4 %  Comprehensive metabolic panel with GFR  Result Value Ref Range   Sodium 139 135 - 145 mEq/L   Potassium 3.7 3.5 - 5.1 mEq/L   Chloride 101 96 - 112 mEq/L   CO2 31 19 - 32 mEq/L   Glucose, Bld 71 70 - 99 mg/dL   BUN 10 6 - 23 mg/dL   Creatinine, Ser 9.10 0.40 - 1.20 mg/dL   Total Bilirubin 1.1 0.2 - 1.2 mg/dL   Alkaline Phosphatase 60 39 - 117 U/L   AST 24 0 - 37 U/L   ALT 28 0 - 35 U/L   Total Protein 7.5 6.0 - 8.3 g/dL   Albumin 4.7 3.5 - 5.2 g/dL   GFR 22.49 >39.99 mL/min   Calcium  9.4 8.4 - 10.5 mg/dL  Lipid panel  Result Value Ref Range   Cholesterol 200 0 - 200 mg/dL   Triglycerides 40.9 0.0 - 149.0 mg/dL   HDL 40.49 >60.99 mg/dL   VLDL 88.1 0.0 - 59.9 mg/dL   LDL Cholesterol 871 (H) 0 - 99 mg/dL   Total CHOL/HDL Ratio 3    NonHDL 140.29   Urinalysis, Routine w reflex microscopic  Result Value Ref Range   Color, Urine YELLOW Yellow;Lt. Yellow;Straw;Dark Yellow;Amber;Green;Red;Brown   APPearance CLEAR Clear;Turbid;Slightly Cloudy;Cloudy   Specific Gravity, Urine <=1.005 (A) 1.000 - 1.030   pH 6.5 5.0 - 8.0   Total Protein, Urine NEGATIVE Negative   Urine Glucose NEGATIVE Negative   Ketones, ur NEGATIVE Negative   Bilirubin Urine NEGATIVE Negative   Hgb urine dipstick SMALL (A) Negative   Urobilinogen, UA 0.2 0.0 - 1.0   Leukocytes,Ua MODERATE (A) Negative   Nitrite NEGATIVE Negative   WBC, UA 7-10/hpf (A) 0-2/hpf   RBC / HPF 3-6/hpf (A) 0-2/hpf    Squamous Epithelial / HPF Few(5-10/hpf) (A) Rare(0-4/hpf)  Hemoglobin A1c  Result Value Ref Range   Hgb  A1c MFr Bld 6.1 4.6 - 6.5 %      The 10-year ASCVD risk score (Arnett DK, et al., 2019) is: 0.4%    Assessment & Plan:   Healthcare maintenance -     CBC -     Urinalysis, Routine w reflex microscopic  Flu vaccine need -     Flu vaccine trivalent PF, 6mos and older(Flulaval,Afluria,Fluarix,Fluzone)  Elevated LDL cholesterol level -     Comprehensive metabolic panel with GFR -     Lipid panel  Screening for diabetes mellitus -     Comprehensive metabolic panel with GFR -     Hemoglobin A1c  Lower resp. tract infection -     DG Chest 2 View; Future -     Azithromycin ; Take 2 tablets on day 1, then 1 tablet daily on days 2 through 5  Dispense: 6 tablet; Refill: 0  Abnormal urinalysis -     Urinalysis, Routine w reflex microscopic; Future -     Urine Culture; Future    Return in about 1 year (around 05/27/2025), or if symptoms worsen or fail to improve.  Discussed that  evidence for memory loss and statins is inconclusive.  Could switch to pravastatin.  Will depend on lipid results.  Ongoing cough productive of purulent phlegm with rhonchi lower lung fields.  Chest x-ray and Zithromax .  Rechecking A1c.  Information given on preventing type 2 diabetes.  Information given on exercising to stay healthy.  Information was given on managing hyperlipidemia.  Information given on health maintenance and disease prevention.  Elsie Sim Lent, MD

## 2024-05-28 ENCOUNTER — Ambulatory Visit: Payer: Self-pay | Admitting: Family Medicine

## 2024-05-28 LAB — COMPREHENSIVE METABOLIC PANEL WITH GFR
ALT: 28 U/L (ref 0–35)
AST: 24 U/L (ref 0–37)
Albumin: 4.7 g/dL (ref 3.5–5.2)
Alkaline Phosphatase: 60 U/L (ref 39–117)
BUN: 10 mg/dL (ref 6–23)
CO2: 31 meq/L (ref 19–32)
Calcium: 9.4 mg/dL (ref 8.4–10.5)
Chloride: 101 meq/L (ref 96–112)
Creatinine, Ser: 0.89 mg/dL (ref 0.40–1.20)
GFR: 77.5 mL/min (ref >60.00)
Glucose, Bld: 71 mg/dL (ref 70–99)
Potassium: 3.7 meq/L (ref 3.5–5.1)
Sodium: 139 meq/L (ref 135–145)
Total Bilirubin: 1.1 mg/dL (ref 0.2–1.2)
Total Protein: 7.5 g/dL (ref 6.0–8.3)

## 2024-05-28 LAB — URINALYSIS, ROUTINE W REFLEX MICROSCOPIC
Bilirubin Urine: NEGATIVE
Ketones, ur: NEGATIVE
Nitrite: NEGATIVE
Specific Gravity, Urine: <=1.005 — AB (ref 1.000–1.030)
Total Protein, Urine: NEGATIVE
Urine Glucose: NEGATIVE
Urobilinogen, UA: 0.2 (ref 0.0–1.0)
pH: 6.5 (ref 5.0–8.0)

## 2024-05-28 LAB — LIPID PANEL
Cholesterol: 200 mg/dL (ref 0–200)
HDL: 59.5 mg/dL (ref >39.00)
LDL Cholesterol: 128 mg/dL — ABNORMAL HIGH (ref 0–99)
NonHDL: 140.29
Total CHOL/HDL Ratio: 3
Triglycerides: 59 mg/dL (ref 0.0–149.0)
VLDL: 11.8 mg/dL (ref 0.0–40.0)

## 2024-05-28 LAB — CBC
HCT: 44.9 % (ref 36.0–46.0)
Hemoglobin: 14.5 g/dL (ref 12.0–15.0)
MCHC: 32.3 g/dL (ref 30.0–36.0)
MCV: 83.4 fl (ref 78.0–100.0)
Platelets: 206 K/uL (ref 150.0–400.0)
RBC: 5.39 Mil/uL — ABNORMAL HIGH (ref 3.87–5.11)
RDW: 13.7 % (ref 11.5–15.5)
WBC: 5.6 K/uL (ref 4.0–10.5)

## 2024-05-28 LAB — HEMOGLOBIN A1C: Hgb A1c MFr Bld: 6.1 % (ref 4.6–6.5)

## 2024-05-28 NOTE — Addendum Note (Signed)
 Addended by: BERNETA FALLOW A on: 05/28/2024 12:15 PM   Modules accepted: Orders

## 2024-07-31 ENCOUNTER — Ambulatory Visit
Admission: EM | Admit: 2024-07-31 | Discharge: 2024-07-31 | Disposition: A | Discharge status: Home / Self Care | Attending: Family Medicine | Admitting: Family Medicine

## 2024-07-31 DIAGNOSIS — B9789 Other viral agents as the cause of diseases classified elsewhere: Secondary | ICD-10-CM | POA: Diagnosis not present

## 2024-07-31 DIAGNOSIS — J040 Acute laryngitis: Secondary | ICD-10-CM | POA: Diagnosis not present

## 2024-07-31 DIAGNOSIS — J988 Other specified respiratory disorders: Secondary | ICD-10-CM | POA: Diagnosis not present

## 2024-07-31 LAB — POCT RAPID STREP A (OFFICE): Rapid Strep A Screen: NEGATIVE

## 2024-07-31 MED ORDER — PROMETHAZINE-DM 6.25-15 MG/5ML PO SYRP: 5.0000 mL | ORAL_SOLUTION | Freq: Three times a day (TID) | ORAL | 0 refills | Status: AC | PRN

## 2024-07-31 MED ORDER — PREDNISONE 10 MG PO TABS: 30.0000 mg | ORAL_TABLET | Freq: Every day | ORAL | 0 refills | Status: AC

## 2024-07-31 MED ORDER — CETIRIZINE HCL 10 MG PO TABS: 10.0000 mg | ORAL_TABLET | Freq: Every day | ORAL | 0 refills | Status: AC

## 2024-07-31 NOTE — ED Provider Notes (Signed)
 " [facility]  Note:  This document was prepared using Conservation officer, historic buildings and may include unintentional dictation errors.  MRN: [id number] DOB: Oct 16, 1975  Subjective:   MALEENA EDDLEMAN is a 48 y.o. female presenting for 2 day history of painful swallowing, hoarseness, sinus congestion, runny nose, productive cough. No fever, chest pain, shob, wheezing. Has a history of allergic rhinitis. No asthma. No smoking of any kind including cigarettes, cigars, vaping, marijuana use.  No diabetes.   Current Outpatient Medications  Medication Instructions   atorvastatin  (LIPITOR) 40 mg, Oral, Daily   cholecalciferol (VITAMIN D3) 2,000 Units, Daily   ibuprofen  (ADVIL ) 800 mg, Oral, Every 8 hours   Multiple Vitamin (MULTIVITAMIN) capsule 1 capsule, Daily   Omega-3 Fatty Acids (FISH OIL) 1000 MG CAPS Take by mouth.   Probiotic Product (PROBIOTIC BLEND PO) Take by mouth.   SUMAtriptan  (IMITREX ) 50 mg, Oral, Every 2 hours PRN, May repeat in 2 hours if headache persists or recurs. No more than 2 each day.   triamcinolone  cream (KENALOG ) 0.1 % 1 Application, Topical, 2 times daily   vitamin C with rose hips 500 mg, Daily    Allergies[1]  Past Medical History:  Diagnosis Date   Acute headache 01/10/2017   Allergy    Cervical radiculopathy 08/16/2019   Chest wall pain 09/11/2018   Chronic abdominal pain 01/10/2017   Congestion of upper airway 06/06/2021   Ear congestion 12/27/2019   Elevated cholesterol 10/03/2012   Formatting of this note might be different from the original. Overview:  10 year risk for heart disease less than 3%   Elevated LDL cholesterol level 10/03/2012   10 year risk for heart disease less than 3%   GERD (gastroesophageal reflux disease)    with pregnancy-no meds   Healthcare maintenance 11/23/2019   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   Hyperlipidemia    Jaw pain 04/01/2019   Lumbar radiculopathy 08/16/2019   Migraine without  aura and without status migrainosus, not intractable 06/28/2021   Muscle fasciculation 11/23/2019   Neck pain 04/01/2019   Formatting of this note might be different from the original. USG right neck negative.  Last Assessment & Plan:  Formatting of this note might be different from the original. Concern about her neck. Chronic history of intermittent discomfort in the primarily right side of her neck.  She has been doing some physical therapy herself along with wearing a night brace for TMJ and does feel symptoms are   Post-nasal drip 06/06/2021   Previous cesarean delivery affecting pregnancy 03/21/2012   Previous cesarean section 10/25/2020   S/P cesarean section 03/21/2012   Strain of calf muscle 12/13/2020   Submandibular gland swelling 01/23/2018   Thoracic myofascial strain 12/20/2020   Vitamin D  deficiency 06/28/2021     Past Surgical History:  Procedure Laterality Date   CESAREAN SECTION  2009, 2011   CESAREAN SECTION  03/20/2012   Procedure: CESAREAN SECTION;  Surgeon: Norleen GORMAN Skill, MD;  Location: [facility];  Service: Gynecology;  Laterality: N/A;   CESAREAN SECTION N/A 10/25/2020   Procedure: REPEAT CESAREAN SECTION PREVIOUS X 3  EDC: 10-30-20 ALLERGIES: PENICILLINS;  Surgeon: Mat Browning, MD;  Location: [facility];  Service: Obstetrics;  Laterality: N/A;   wisdom teeth removal      Family History  Problem Relation Age of Onset   Hyperlipidemia Mother    Diabetes Mother    Arthritis Mother    Cataracts Mother    Alcohol  abuse Father    Stroke Father    Emphysema Father    Hyperlipidemia Sister    Anemia Sister    Hyperlipidemia Sister    Anemia Sister    Cancer Maternal Grandfather    Colon cancer Neg Hx    Colon polyps Neg Hx    Esophageal cancer Neg Hx    Stomach cancer Neg Hx    Rectal cancer Neg Hx     Social History   Occupational History   Not on file  Tobacco Use   Smoking status: Never   Smokeless tobacco: Never  Vaping Use   Vaping status:  Never Used  Substance and Sexual Activity   Alcohol use: No   Drug use: No   Sexual activity: Yes    Birth control/protection: Condom     ROS   Objective:   Vitals: BP 121/75 (BP Location: Right Arm)   Pulse 99   Temp 98.5 F (36.9 C) (Oral)   Resp 20   LMP 07/11/2024   SpO2 98%   Physical Exam Constitutional:      General: She is not in acute distress.    Appearance: Normal appearance. She is well-developed and normal weight. She is not ill-appearing, toxic-appearing or diaphoretic.  HENT:     Head: Normocephalic and atraumatic.     Right Ear: Tympanic membrane, ear canal and external ear normal. No drainage or tenderness. No middle ear effusion. There is no impacted cerumen. Tympanic membrane is not erythematous or bulging.     Left Ear: Tympanic membrane, ear canal and external ear normal. No drainage or tenderness.  No middle ear effusion. There is no impacted cerumen. Tympanic membrane is not erythematous or bulging.     Nose: Nose normal. No congestion or rhinorrhea.     Mouth/Throat:     Mouth: Mucous membranes are moist. No oral lesions.     Pharynx: No pharyngeal swelling, oropharyngeal exudate, posterior oropharyngeal erythema or uvula swelling.     Tonsils: No tonsillar exudate or tonsillar abscesses.  Eyes:     General: No scleral icterus.       Right eye: No discharge.        Left eye: No discharge.     Extraocular Movements: Extraocular movements intact.     Right eye: Normal extraocular motion.     Left eye: Normal extraocular motion.     Conjunctiva/sclera: Conjunctivae normal.  Cardiovascular:     Rate and Rhythm: Normal rate and regular rhythm.     Heart sounds: Normal heart sounds. No murmur heard.    No friction rub. No gallop.  Pulmonary:     Effort: Pulmonary effort is normal. No respiratory distress.     Breath sounds: No stridor. No wheezing, rhonchi or rales.  Chest:     Chest wall: No tenderness.  Musculoskeletal:     Cervical back:  Normal range of motion and neck supple.  Lymphadenopathy:     Cervical: No cervical adenopathy.  Skin:    General: Skin is warm and dry.  Neurological:     General: No focal deficit present.     Mental Status: She is alert and oriented to person, place, and time.  Psychiatric:        Mood and Affect: Mood normal.        Behavior: Behavior normal.    Results for orders placed or performed during the hospital encounter of 07/31/24 (from the past 24 hours)  POCT rapid strep A  Status: None   Collection Time: 07/31/24 12:58 PM  Result Value Ref Range   Rapid Strep A Screen Negative Negative    Assessment and Plan :   PDMP not reviewed this encounter.  1. Viral respiratory infection   2. Laryngitis     Has a viral URI complicating to laryngitis, recommend prednisone , supportive care. Deferred imaging given clear cardiopulmonary exam, hemodynamically stable vital signs. Counseled patient on potential for adverse effects with medications prescribed/recommended today, ER and return-to-clinic precautions discussed, patient verbalized understanding.     [1]  Allergies Allergen Reactions   Penicillins Other (See Comments), Rash and Dermatitis    Childhood reaction   Other Itching    Tumeric causes my lips to itch and break out.      Christopher Savannah, [place] 07/31/24 [id number]  "

## 2024-07-31 NOTE — Discharge Instructions (Addendum)
 We will manage this as a viral respiratory infection causing laryngitis. For sore throat or cough try using a honey-based tea. Use 3 teaspoons of honey with juice squeezed from half lemon. Place shaved pieces of ginger into 1/2-1 cup of water and warm over stove top. Then mix the ingredients and repeat every 4 hours as needed. Please take Tylenol  500mg -650mg  once every 6 hours for fevers, aches and pains. Hydrate very well with at least 2 liters (64 ounces) of water. Eat light meals such as soups (chicken and noodles, chicken wild rice, vegetable).  Do not eat any foods that you are allergic to.  Start an antihistamine like Zyrtec  (10mg  daily) for postnasal drainage, sinus congestion.  You can take this together with pseudoephedrine  prednisone  for the significant sinus symptoms, laryngitis. Use cough syrup as needed.

## 2024-07-31 NOTE — ED Triage Notes (Signed)
 Pt c/o sore throat,runny nose, mild cough-sx started 2 days ago-denies fever-taking OTC cold and flu meds/last dose ~6am-NAD-steady gait

## 2024-08-03 ENCOUNTER — Telehealth: Payer: Self-pay | Admitting: Family Medicine

## 2024-08-03 ENCOUNTER — Telehealth: Payer: Self-pay

## 2024-08-03 NOTE — Telephone Encounter (Signed)
 Copied from CRM [id number]. Topic: Clinical - Lab/Test Results >> Aug 03, 2024 10:25 AM Charolett L wrote: Reason for CRM: Patient is requesting a call back to go over lab results.SABRA CB# [phone]

## 2024-08-03 NOTE — Telephone Encounter (Signed)
Unable to leave VM due to mail box is full.  Dm/cma  

## 2024-08-03 NOTE — Telephone Encounter (Signed)
 Pt reurned call for lab results

## 2024-08-03 NOTE — Telephone Encounter (Signed)
 Spoke to patient and advised that we wanted to repeat that urine in October and since it has been 2 months it doesn't need done now (per provider).   Called patient back to advise of recommendations. Left VM Dm/cma

## 2024-09-01 ENCOUNTER — Ambulatory Visit: Admitting: Internal Medicine

## 2024-09-01 ENCOUNTER — Encounter: Payer: Self-pay | Admitting: Internal Medicine

## 2024-09-01 VITALS — BP 122/80 | HR 83 | Temp 98.2°F | Ht 66.5 in | Wt 169.4 lb

## 2024-09-01 DIAGNOSIS — S92535D Nondisplaced fracture of distal phalanx of left lesser toe(s), subsequent encounter for fracture with routine healing: Secondary | ICD-10-CM | POA: Diagnosis not present

## 2024-09-01 NOTE — Patient Instructions (Signed)
 Rest Ice - 20 minute increments , 2-4 times a day  Elevate leg when possible above level of heart  Continue orthopedic shoe  Ibuprofen  for pain control   If no improvement within 2-3 weeks, notify us  and I will place orthopedic referral

## 2024-09-01 NOTE — Progress Notes (Signed)
 " [facility] [address] [place] [address] Dept: [phone] Dept Fax: [phone]  Acute Care Office Visit  Subjective:   Cathy Hamilton [birth year] 09/01/2024  Chief Complaint  Patient presents with   Toe Injury    Left ring toe wanting to be sure its ok    HPI:  Discussed the use of AI scribe software for clinical note transcription with the patient, who gave verbal consent to proceed.  History of Present Illness   Cathy Hamilton is a 49 year old female who presents for a follow-up on her toe fracture.  She sustained a toe fracture on January 4th, 2026, confirmed by an X-ray at a local urgent care, showing an acute avulsion fracture of the middle phalanx of the fourth digit on her left foot with associated soft tissue swelling.  The toe remains tender and slightly swollen, especially when touched or wrapped. No numbness or tingling is present.  For pain management, she takes ibuprofen  and Tylenol  as needed and applies ice to the area. She rests, avoids extensive walking, uses a Hartzell shoe, and buddy tapes the toes. She is cautious about weight-bearing on the affected foot.      Imaging Results - XR Toes Minimum 2 Views Left (08/15/2024 8:59 AM EST) Impressions  08/15/2024 10:21 AM EST  1.  A sliver of bone along the dorsal and medial aspect of the middle phalanx of the fourth digit in keeping with acute avulsion fracture with superimposed soft tissue swelling 2.  No malalignment. 3.  Joint spaces are maintained.    The following portions of the patient's history were reviewed and updated as appropriate: past medical history, past surgical history, family history, social history, allergies, medications, and problem list.   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   Thoracic myofascial strain 12/20/2020   Strain of calf muscle 12/13/2020   Previous cesarean section 10/25/2020   Ear congestion 12/27/2019   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   Muscle fasciculation 11/23/2019   Healthcare maintenance 11/23/2019   Cervical radiculopathy 08/16/2019   Lumbar radiculopathy 08/16/2019   Neck pain 04/01/2019   Jaw pain 04/01/2019   Chest wall pain 09/11/2018   Submandibular gland swelling 01/23/2018   Acute headache 01/10/2017   Chronic abdominal pain 01/10/2017   Elevated LDL cholesterol level 10/03/2012   Elevated cholesterol 10/03/2012   Previous cesarean delivery affecting pregnancy 03/21/2012    Class: Present on Admission   S/P cesarean section 03/21/2012    Class: Status post   Past Medical History:  Diagnosis Date   Acute headache 01/10/2017   Allergy    Cervical radiculopathy 08/16/2019   Chest wall pain 09/11/2018   Chronic abdominal pain 01/10/2017   Congestion of upper airway 06/06/2021   Ear congestion 12/27/2019   Elevated cholesterol 10/03/2012   Formatting of this note might be different from the original. Overview:  10 year risk for heart disease less than 3%   Elevated LDL cholesterol level 10/03/2012   10 year risk for heart disease  less than 3%   GERD (gastroesophageal reflux disease)    with pregnancy-no meds   Healthcare maintenance 11/23/2019   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   Hyperlipidemia    Jaw pain 04/01/2019   Lumbar radiculopathy 08/16/2019   Migraine without aura and without status migrainosus, not intractable 06/28/2021   Muscle fasciculation 11/23/2019   Neck  pain 04/01/2019   Formatting of this note might be different from the original. USG right neck negative.  Last Assessment & Plan:  Formatting of this note might be different from the original. Concern about her neck. Chronic history of intermittent discomfort in the primarily right side of her neck.  She has been doing some physical therapy herself along with wearing a night brace for TMJ and does feel symptoms are   Post-nasal drip 06/06/2021   Previous cesarean delivery affecting pregnancy 03/21/2012   Previous cesarean section 10/25/2020   S/P cesarean section 03/21/2012   Strain of calf muscle 12/13/2020   Submandibular gland swelling 01/23/2018   Thoracic myofascial strain 12/20/2020   Vitamin D  deficiency 06/28/2021   Past Surgical History:  Procedure Laterality Date   CESAREAN SECTION  2009, 2011   CESAREAN SECTION  03/20/2012   Procedure: CESAREAN SECTION;  Surgeon: Norleen GORMAN Skill, MD;  Location: [facility];  Service: Gynecology;  Laterality: N/A;   CESAREAN SECTION N/A 10/25/2020   Procedure: REPEAT CESAREAN SECTION PREVIOUS X 3  EDC: 10-30-20 ALLERGIES: PENICILLINS;  Surgeon: Mat Browning, MD;  Location: [facility];  Service: Obstetrics;  Laterality: N/A;   wisdom teeth removal     Family History  Problem Relation Age of Onset   Hyperlipidemia Mother    Diabetes Mother    Arthritis Mother    Cataracts Mother    Alcohol abuse Father    Stroke Father    Emphysema Father    Hyperlipidemia Sister    Anemia Sister    Hyperlipidemia Sister    Anemia Sister    Cancer Maternal Grandfather    Colon cancer Neg Hx    Colon polyps Neg Hx    Esophageal cancer Neg Hx    Stomach cancer Neg Hx    Rectal cancer Neg Hx    Current Medications[1] Allergies[2]   ROS: A complete ROS was performed with pertinent positives/negatives noted in the HPI. The remainder of the ROS are negative.    Objective:   Today's Vitals   09/01/24 1535  BP: 122/80  Pulse: 83  Temp: 98.2 F (36.8 C)   TempSrc: Temporal  SpO2: 98%  Weight: 169 lb 6.4 oz (76.8 kg)  Height: 5' 6.5 (1.689 m)    GENERAL: Well-appearing, in NAD. Well nourished.  SKIN: Pink, warm and dry. No rash, lesion, ulceration, or ecchymoses.  RESPIRATORY: Respirations even and non-labored.  CARDIAC: Peripheral pulses 2+ bilaterally.  MSK: Muscle tone and strength appropriate for age. Mild swelling to dorsal aspect of left 4th toe . No redness , no open wounds.  EXTREMITIES: Without clubbing, cyanosis, or edema.  NEUROLOGIC: No motor or sensory deficits. PSYCH/MENTAL STATUS: Alert, oriented x 3. Cooperative, appropriate mood and affect.    No results found for any visits on 09/01/24.    Assessment & Plan:  Assessment and Plan    Closed nondisplaced fracture of distal phalanx of left fourth toe, routine healing - Continue ibuprofen  and acetaminophen  for pain management. - Elevate foot above heart level to reduce swelling. - Apply ice packs wrapped in cloth for 20 minutes increments. - Continue wearing  Hartzell shoe  - Buddy tape affected toe to third toe - Consider referral to orthopedics if significant pain or discomfort persists in 2-3 weeks.     No orders of the defined types were placed in this encounter.  No orders of the defined types were placed in this encounter.  Lab Orders  No laboratory test(s) ordered today   No images are attached to the encounter or orders placed in the encounter.  Return if symptoms worsen or fail to improve.   Rosina Senters, FNP    [1]  Current Outpatient Medications:    Ascorbic Acid (VITAMIN C WITH ROSE HIPS) 500 MG tablet, Take 500 mg by mouth daily., Disp: , Rfl:    atorvastatin  (LIPITOR) 40 MG tablet, TAKE 1 TABLET BY MOUTH EVERY DAY, Disp: 90 tablet, Rfl: 3   cetirizine  (ZYRTEC  ALLERGY) 10 MG tablet, Take 1 tablet (10 mg total) by mouth daily., Disp: 30 tablet, Rfl: 0   cholecalciferol (VITAMIN D3) 25 MCG (1000 UNIT) tablet, Take 2,000 Units by mouth daily.,  Disp: , Rfl:    ibuprofen  (ADVIL ) 800 MG tablet, Take 1 tablet (800 mg total) by mouth every 8 (eight) hours., Disp: 30 tablet, Rfl: 0   Multiple Vitamin (MULTIVITAMIN) capsule, Take 1 capsule by mouth daily., Disp: , Rfl:    Probiotic Product (PROBIOTIC BLEND PO), Take by mouth., Disp: , Rfl:    SUMAtriptan  (IMITREX ) 50 MG tablet, Take 1 tablet (50 mg total) by mouth every 2 (two) hours as needed for migraine. May repeat in 2 hours if headache persists or recurs. No more than 2 each day., Disp: 10 tablet, Rfl: 3   Omega-3 Fatty Acids (FISH OIL) 1000 MG CAPS, Take by mouth. (Patient not taking: Reported on 09/01/2024), Disp: , Rfl:    predniSONE  (DELTASONE ) 10 MG tablet, Take 3 tablets (30 mg total) by mouth daily with breakfast. (Patient not taking: Reported on 09/01/2024), Disp: 15 tablet, Rfl: 0   promethazine -dextromethorphan (PROMETHAZINE -DM) 6.25-15 MG/5ML syrup, Take 5 mLs by mouth 3 (three) times daily as needed for cough. (Patient not taking: Reported on 09/01/2024), Disp: 200 mL, Rfl: 0   triamcinolone  cream (KENALOG ) 0.1 %, Apply 1 Application topically 2 (two) times daily. (Patient not taking: Reported on 05/27/2024), Disp: 30 g, Rfl: 0 [2]  Allergies Allergen Reactions   Penicillins Other (See Comments), Rash and Dermatitis    Childhood reaction   Other Itching    Tumeric causes my lips to itch and break out.    "

## 2024-09-16 ENCOUNTER — Other Ambulatory Visit: Payer: Self-pay | Admitting: Family Medicine

## 2024-09-16 DIAGNOSIS — E7800 Pure hypercholesterolemia, unspecified: Secondary | ICD-10-CM

## 2024-10-06 ENCOUNTER — Ambulatory Visit: Admitting: Dermatology
